# Patient Record
Sex: Male | Born: 1970 | Race: Black or African American | Hispanic: No | Marital: Single | Smoking: Current every day smoker
Health system: Southern US, Community
[De-identification: ages and names within clinical notes are randomized; demographics above are authoritative.]

## PROBLEM LIST (undated history)

## (undated) DIAGNOSIS — I4891 Unspecified atrial fibrillation: Secondary | ICD-10-CM

---

## 2019-04-21 ENCOUNTER — Encounter (HOSPITAL_COMMUNITY): Payer: Self-pay

## 2019-04-21 ENCOUNTER — Emergency Department (HOSPITAL_COMMUNITY)
Admission: EM | Admit: 2019-04-21 | Discharge: 2019-04-21 | Disposition: A | Discharge status: Home / Self Care | Payer: BLUE CROSS/BLUE SHIELD | Attending: Emergency Medicine | Admitting: Emergency Medicine

## 2019-04-21 DIAGNOSIS — F17210 Nicotine dependence, cigarettes, uncomplicated: Secondary | ICD-10-CM | POA: Diagnosis not present

## 2019-04-21 DIAGNOSIS — Z9114 Patient's other noncompliance with medication regimen: Secondary | ICD-10-CM | POA: Diagnosis not present

## 2019-04-21 DIAGNOSIS — I480 Paroxysmal atrial fibrillation: Secondary | ICD-10-CM | POA: Diagnosis not present

## 2019-04-21 DIAGNOSIS — R0602 Shortness of breath: Secondary | ICD-10-CM | POA: Diagnosis present

## 2019-04-21 DIAGNOSIS — Z7982 Long term (current) use of aspirin: Secondary | ICD-10-CM | POA: Diagnosis not present

## 2019-04-21 HISTORY — DX: Unspecified atrial fibrillation: I48.91

## 2019-04-21 LAB — BASIC METABOLIC PANEL
Anion gap: 8 (ref 5–15)
BUN: 14 mg/dL (ref 6–20)
CO2: 25 mmol/L (ref 22–32)
Calcium: 8.7 mg/dL — ABNORMAL LOW (ref 8.9–10.3)
Chloride: 109 mmol/L (ref 98–111)
Creatinine, Ser: 1.13 mg/dL (ref 0.61–1.24)
GFR calc Af Amer: >60 mL/min (ref >60)
GFR calc non Af Amer: >60 mL/min (ref >60)
Glucose, Bld: 99 mg/dL (ref 70–99)
Potassium: 4.3 mmol/L (ref 3.5–5.1)
Sodium: 142 mmol/L (ref 135–145)

## 2019-04-21 LAB — CBC WITH DIFFERENTIAL/PLATELET
Abs Immature Granulocytes: 0.01 10*3/uL (ref 0.00–0.07)
Basophils Absolute: 0 10*3/uL (ref 0.0–0.1)
Basophils Relative: 0 %
Eosinophils Absolute: 0.2 10*3/uL (ref 0.0–0.5)
Eosinophils Relative: 2 %
HCT: 41.1 % (ref 39.0–52.0)
Hemoglobin: 12.8 g/dL — ABNORMAL LOW (ref 13.0–17.0)
Immature Granulocytes: 0 %
Lymphocytes Relative: 23 %
Lymphs Abs: 2 10*3/uL (ref 0.7–4.0)
MCH: 24.1 pg — ABNORMAL LOW (ref 26.0–34.0)
MCHC: 31.1 g/dL (ref 30.0–36.0)
MCV: 77.3 fL — ABNORMAL LOW (ref 80.0–100.0)
Monocytes Absolute: 0.6 10*3/uL (ref 0.1–1.0)
Monocytes Relative: 7 %
Neutro Abs: 5.8 10*3/uL (ref 1.7–7.7)
Neutrophils Relative %: 68 %
Platelets: 293 10*3/uL (ref 150–400)
RBC: 5.32 MIL/uL (ref 4.22–5.81)
RDW: 15.9 % — ABNORMAL HIGH (ref 11.5–15.5)
WBC: 8.7 10*3/uL (ref 4.0–10.5)
nRBC: 0 % (ref 0.0–0.2)

## 2019-04-21 LAB — MAGNESIUM: Magnesium: 2 mg/dL (ref 1.7–2.4)

## 2019-04-21 LAB — TSH: TSH: 2.469 u[IU]/mL (ref 0.350–4.500)

## 2019-04-21 MED ORDER — METOPROLOL TARTRATE 25 MG PO TABS: 25.0000 mg | ORAL_TABLET | Freq: Two times a day (BID) | ORAL | 0 refills | Status: DC

## 2019-04-21 NOTE — ED Provider Notes (Signed)
[facility] EMERGENCY DEPARTMENT Provider Note   CSN: [id number] Arrival date & time: 04/21/19  9381     History Chief Complaint  Patient presents with  . Atrial Fibrillation    Jorge Gonzalez is a 49 y.o. male with PMH significant for atrial fibrillation who presents to the ED with acute onset palpitations concerning for A. Fib.  Patient reports that he felt fine yesterday when going to bed and immediately woke up, however around 7:30 AM he began feeling palpitations with mild associated shortness of breath symptoms.  Patient reports that he had been prescribed warfarin and propafenone in the past but has not taken those medications in over a year and denies any other recent episodes of atrial fibrillation.  He denies any other PMH such as high blood pressure, cholesterol, or diabetes.  He does not take medications regularly.  He works as a Psychologist, occupational for [employer] and gets regularly tested for COVID-19.  Per EMS, patient was provided 500 mL NS.  He denies any current chest pain, fevers or chills, headache or dizziness, recent illness, cough, abdominal discomfort, nausea or vomiting, or other symptoms.  HPI     Past Medical History:  Diagnosis Date  . Afib (HCC)     There are no problems to display for this patient.    No family history on file.  Social History   Tobacco Use  . Smoking status: Current Every Day Smoker    Packs/day: 0.50    Types: Cigarettes  . Smokeless tobacco: Never Used  Substance Use Topics  . Alcohol use: Not on file  . Drug use: Not on file    Home Medications Prior to Admission medications   Medication Sig Start Date End Date Taking? Authorizing Provider  aspirin 325 MG EC tablet Take 325 mg by mouth daily.   Yes [provider]    Allergies    Patient has no known allergies.  Review of Systems   Review of Systems  Constitutional: Negative for chills and fever.  Respiratory: Positive for shortness of breath.  Negative for cough.   Cardiovascular: Positive for palpitations. Negative for chest pain and leg swelling.     Physical Exam Updated Vital Signs BP (!) 128/100   Pulse (!) 51   Temp 97.9 F (36.6 C) (Oral)   Resp (!) 21   Ht 5\' 8"  (1.727 m)   Wt 86.2 kg   SpO2 99%   BMI 28.89 kg/m   Physical Exam Vitals and nursing note reviewed. Exam conducted with a chaperone present.  Constitutional:      General: He is not in acute distress.    Appearance: Normal appearance.  HENT:     Head: Normocephalic and atraumatic.  Eyes:     General: No scleral icterus.    Extraocular Movements: Extraocular movements intact.     Conjunctiva/sclera: Conjunctivae normal.     Pupils: Pupils are equal, round, and reactive to light.  Cardiovascular:     Rate and Rhythm: Normal rate. Rhythm irregular.     Pulses: Normal pulses.     Heart sounds: Normal heart sounds.  Pulmonary:     Effort: Pulmonary effort is normal. No respiratory distress.     Breath sounds: Normal breath sounds.  Musculoskeletal:        General: Normal range of motion.     Cervical back: Normal range of motion and neck supple. No rigidity.  Skin:    General: Skin is dry.  Capillary Refill: Capillary refill takes less than 2 seconds.  Neurological:     Mental Status: He is alert and oriented to person, place, and time.     GCS: GCS eye subscore is 4. GCS verbal subscore is 5. GCS motor subscore is 6.     Gait: Gait normal.  Psychiatric:        Mood and Affect: Mood normal.        Behavior: Behavior normal.        Thought Content: Thought content normal.       ED Results / Procedures / Treatments   Labs (all labs ordered are listed, but only abnormal results are displayed) Labs Reviewed  CBC WITH DIFFERENTIAL/PLATELET - Abnormal; Notable for the following components:      Result Value   Hemoglobin 12.8 (*)    MCV 77.3 (*)    MCH 24.1 (*)    RDW 15.9 (*)    All other components within normal limits  BASIC  METABOLIC PANEL - Abnormal; Notable for the following components:   Calcium 8.7 (*)    All other components within normal limits  TSH  MAGNESIUM    EKG EKG Interpretation  Date/Time:  Monday April 21 2019 08:27:28 EST Ventricular Rate:  109 PR Interval:    QRS Duration: 79 QT Interval:  323 QTC Calculation: 435 R Axis:   50 Text Interpretation: Atrial fibrillation No old tracing to compare Confirmed by Meridee Score [phone]) on 04/21/2019 8:34:53 AM   Radiology No results found.  Procedures Procedures (including critical care time)  Medications Ordered in ED Medications - No data to display  ED Course  I have reviewed the triage vital signs and the nursing notes.  Pertinent labs & imaging results that were available during my care of the patient were reviewed by me and considered in my medical decision making (see chart for details).  Clinical Course as of Apr 20 1133  Mon Apr 21, 2019  1811 49 year old male history of paroxysmal A. fib here feeling like he is in A. fib with shortness of breath for about an hour.  Not on anticoagulation.  No recent illness.  Getting labs EKG cardiac monitoring.   [MB]    Clinical Course User Index [MB] Terrilee Files, MD   MDM Rules/Calculators/A&P                      Patient is mildly symptomatic of his current episode of atrial fibrillation.  Given acuity of onset atrial fibrillation < 2 hours, do not need to anticoagulate prior to rhythm control.  Patient is resting relatively comfortably on my exam and is in no acute distress.  His CHA2DS2-VASc score is 0.    On reevaluation, patient is still complaining of his palpitations and has not spontaneously cardioverted.  We will consult cardiology.   Spoke with Dr. Cristal Deer from [facility] and she recommends two options with which we can engage in mutual decision making with patient.  We can either start him on metoprolol 25 mg twice daily and have him  follow-up with amatory referral to atrial fibrillation clinic or we can start him on warfarin prior to a cardioversion with continued warfarin afterwards.  While he believes that his episode just began this morning, it is difficult to know for certain and with cardioversion comes risk of thromboembolism.   Discussed the two options with the patient and he would prefer to start metoprolol 25 mg twice  daily and follow-up with the ambulatory referral to the atrial fibrillation clinic rather than restarting warfarin before being cardioverted here in the ED.  Dr. Melina Copa also aware of current assessment and plan and is in agreement.   Strict return precautions discussed.  All of the evaluation and work-up results were discussed with the patient and any family at bedside. They were provided opportunity to ask any additional questions and have none at this time. They have expressed understanding of verbal discharge instructions as well as return precautions and are agreeable to the plan.    Final Clinical Impression(s) / ED Diagnoses Final diagnoses:  Paroxysmal atrial fibrillation [facility])    Rx / DC Orders ED Discharge Orders         Ordered    Amb referral to AFIB Clinic     04/21/19 1041           Reita Chard 04/21/19 1135    Hayden Rasmussen, MD 04/21/19 [phone]

## 2019-04-21 NOTE — ED Triage Notes (Signed)
Pt presents to the ED by [facility] complaining of some shortness of breath. Pt reports he could feel himself go into a-fib about an hour ago. Has not been taking medications for about the last year per EMS. Given 500cc of NSS by EMS, they reported HR 80-140's en route to the ED. Pt arrives to the ED alert and oriented x4, denying any pain at presnt time. HR 107 on the monitor.

## 2019-04-21 NOTE — Discharge Instructions (Addendum)
Please follow-up with the atrial fibrillation clinic and go to your appointment, once scheduled.  Please monitor your heart rate and hold your metoprolol if your heart rate is persisting below 60 beats per minute.   It is also important that you get established with a primary care provider as soon as possible for ongoing evaluation and management of your health and wellbeing.  Please contact your insurance provider to determine who in the area accept your insurance.  Return to the ED or seek immediate medical attention for any new or worsening symptoms.

## 2019-05-09 ENCOUNTER — Encounter (HOSPITAL_COMMUNITY): Payer: Self-pay | Admitting: *Deleted

## 2019-12-01 ENCOUNTER — Encounter (HOSPITAL_BASED_OUTPATIENT_CLINIC_OR_DEPARTMENT_OTHER): Payer: Self-pay

## 2019-12-01 ENCOUNTER — Emergency Department (HOSPITAL_BASED_OUTPATIENT_CLINIC_OR_DEPARTMENT_OTHER): Payer: Self-pay

## 2019-12-01 ENCOUNTER — Other Ambulatory Visit: Payer: Self-pay

## 2019-12-01 ENCOUNTER — Inpatient Hospital Stay (HOSPITAL_BASED_OUTPATIENT_CLINIC_OR_DEPARTMENT_OTHER)
Admission: EM | Admit: 2019-12-01 | Discharge: 2019-12-05 | DRG: 193 | Disposition: A | Discharge status: Home / Self Care | Payer: Self-pay | Attending: Internal Medicine | Admitting: Internal Medicine

## 2019-12-01 DIAGNOSIS — Z7982 Long term (current) use of aspirin: Secondary | ICD-10-CM

## 2019-12-01 DIAGNOSIS — Z720 Tobacco use: Secondary | ICD-10-CM | POA: Diagnosis present

## 2019-12-01 DIAGNOSIS — Z6830 Body mass index (BMI) 30.0-30.9, adult: Secondary | ICD-10-CM

## 2019-12-01 DIAGNOSIS — E669 Obesity, unspecified: Secondary | ICD-10-CM | POA: Diagnosis present

## 2019-12-01 DIAGNOSIS — F17210 Nicotine dependence, cigarettes, uncomplicated: Secondary | ICD-10-CM | POA: Diagnosis present

## 2019-12-01 DIAGNOSIS — Z713 Dietary counseling and surveillance: Secondary | ICD-10-CM

## 2019-12-01 DIAGNOSIS — R0902 Hypoxemia: Secondary | ICD-10-CM

## 2019-12-01 DIAGNOSIS — N179 Acute kidney failure, unspecified: Secondary | ICD-10-CM | POA: Diagnosis present

## 2019-12-01 DIAGNOSIS — J9601 Acute respiratory failure with hypoxia: Secondary | ICD-10-CM | POA: Diagnosis present

## 2019-12-01 DIAGNOSIS — N181 Chronic kidney disease, stage 1: Secondary | ICD-10-CM | POA: Diagnosis present

## 2019-12-01 DIAGNOSIS — Z79899 Other long term (current) drug therapy: Secondary | ICD-10-CM

## 2019-12-01 DIAGNOSIS — I480 Paroxysmal atrial fibrillation: Secondary | ICD-10-CM | POA: Diagnosis present

## 2019-12-01 DIAGNOSIS — Z716 Tobacco abuse counseling: Secondary | ICD-10-CM

## 2019-12-01 DIAGNOSIS — Z20822 Contact with and (suspected) exposure to covid-19: Secondary | ICD-10-CM | POA: Diagnosis present

## 2019-12-01 DIAGNOSIS — J189 Pneumonia, unspecified organism: Principal | ICD-10-CM | POA: Diagnosis present

## 2019-12-01 DIAGNOSIS — Z9119 Patient's noncompliance with other medical treatment and regimen: Secondary | ICD-10-CM

## 2019-12-01 DIAGNOSIS — J188 Other pneumonia, unspecified organism: Secondary | ICD-10-CM | POA: Diagnosis present

## 2019-12-01 LAB — TROPONIN I (HIGH SENSITIVITY)
Troponin I (High Sensitivity): 4 ng/L (ref <18)
Troponin I (High Sensitivity): 4 ng/L (ref <18)

## 2019-12-01 LAB — SARS CORONAVIRUS 2 BY RT PCR (HOSPITAL ORDER, PERFORMED IN ~~LOC~~ HOSPITAL LAB): SARS Coronavirus 2: NEGATIVE

## 2019-12-01 LAB — CBC
HCT: 40.7 % (ref 39.0–52.0)
Hemoglobin: 13 g/dL (ref 13.0–17.0)
MCH: 24.5 pg — ABNORMAL LOW (ref 26.0–34.0)
MCHC: 31.9 g/dL (ref 30.0–36.0)
MCV: 76.6 fL — ABNORMAL LOW (ref 80.0–100.0)
Platelets: 270 10*3/uL (ref 150–400)
RBC: 5.31 MIL/uL (ref 4.22–5.81)
RDW: 15.6 % — ABNORMAL HIGH (ref 11.5–15.5)
WBC: 19.6 10*3/uL — ABNORMAL HIGH (ref 4.0–10.5)
nRBC: 0 % (ref 0.0–0.2)

## 2019-12-01 LAB — URINALYSIS, COMPLETE (UACMP) WITH MICROSCOPIC
Bilirubin Urine: NEGATIVE
Glucose, UA: NEGATIVE mg/dL
Ketones, ur: NEGATIVE mg/dL
Leukocytes,Ua: NEGATIVE
Nitrite: NEGATIVE
Protein, ur: NEGATIVE mg/dL
Specific Gravity, Urine: 1.01 (ref 1.005–1.030)
WBC, UA: NONE SEEN WBC/hpf (ref 0–5)
pH: 6 (ref 5.0–8.0)

## 2019-12-01 LAB — HIV ANTIBODY (ROUTINE TESTING W REFLEX): HIV Screen 4th Generation wRfx: NONREACTIVE

## 2019-12-01 LAB — COMPREHENSIVE METABOLIC PANEL
ALT: 28 U/L (ref 0–44)
AST: 39 U/L (ref 15–41)
Albumin: 3.6 g/dL (ref 3.5–5.0)
Alkaline Phosphatase: 71 U/L (ref 38–126)
Anion gap: 6 (ref 5–15)
BUN: 20 mg/dL (ref 6–20)
CO2: 28 mmol/L (ref 22–32)
Calcium: 8.4 mg/dL — ABNORMAL LOW (ref 8.9–10.3)
Chloride: 104 mmol/L (ref 98–111)
Creatinine, Ser: 1.25 mg/dL — ABNORMAL HIGH (ref 0.61–1.24)
GFR calc Af Amer: >60 mL/min (ref >60)
GFR calc non Af Amer: >60 mL/min (ref >60)
Glucose, Bld: 100 mg/dL — ABNORMAL HIGH (ref 70–99)
Potassium: 4.5 mmol/L (ref 3.5–5.1)
Sodium: 138 mmol/L (ref 135–145)
Total Bilirubin: 0.5 mg/dL (ref 0.3–1.2)
Total Protein: 6.5 g/dL (ref 6.5–8.1)

## 2019-12-01 LAB — C-REACTIVE PROTEIN: CRP: 12.2 mg/dL — ABNORMAL HIGH (ref <1.0)

## 2019-12-01 LAB — PROCALCITONIN: Procalcitonin: 6.07 ng/mL

## 2019-12-01 LAB — D-DIMER, QUANTITATIVE: D-Dimer, Quant: 0.4 ug/mL-FEU (ref 0.00–0.50)

## 2019-12-01 LAB — BRAIN NATRIURETIC PEPTIDE: B Natriuretic Peptide: 73.1 pg/mL (ref 0.0–100.0)

## 2019-12-01 LAB — LACTIC ACID, PLASMA: Lactic Acid, Venous: 1.1 mmol/L (ref 0.5–1.9)

## 2019-12-01 MED ORDER — SODIUM CHLORIDE 0.9 % IV SOLN
2.0000 g | Freq: Once | INTRAVENOUS | Status: AC
  Administered 2019-12-01: 2 g via INTRAVENOUS
  Filled 2019-12-01: qty 20

## 2019-12-01 MED ORDER — SODIUM CHLORIDE 0.9 % IV SOLN
500.0000 mg | Freq: Once | INTRAVENOUS | Status: AC
  Administered 2019-12-01: 500 mg via INTRAVENOUS
  Filled 2019-12-01: qty 500

## 2019-12-01 MED ORDER — LACTATED RINGERS IV SOLN: INTRAVENOUS | Status: AC

## 2019-12-01 MED ORDER — LACTATED RINGERS IV BOLUS
1000.0000 mL | Freq: Once | INTRAVENOUS | Status: AC
  Administered 2019-12-01: 1000 mL via INTRAVENOUS

## 2019-12-01 MED ORDER — IOHEXOL 350 MG/ML SOLN
100.0000 mL | Freq: Once | INTRAVENOUS | Status: AC | PRN
  Administered 2019-12-01: 100 mL via INTRAVENOUS

## 2019-12-01 NOTE — ED Provider Notes (Signed)
[facility]-EMERG[facility]Y DEPT [facility] Emergency Department Provider Note MRN:  [id number]  Arrival date & time: 12/01/19     Chief Complaint   Cough   History of Present Illness   Iris Tatsch is a 49 y.o. year-old male with a history of A. fib presenting to the ED with chief complaint of cough.  Patient worried that he might have Covid, has been having he assumes or Covid symptoms since 2 AM this morning.  Began with shaking chills, having some cough throughout the day, and now experiencing shortness of breath.  Denies chest pain, no abdominal pain, mild headache, no body aches, no numbness or weakness.  Symptoms constant, moderate, no exacerbating or alleviating factors.  Review of Systems  A complete 10 system review of systems was obtained and all systems are negative except as noted in the HPI and PMH.   Patient's Health History    Past Medical History:  Diagnosis Date  . Afib (HCC)     History reviewed. No pertinent surgical history.  No family history on file.  Social History   Socioeconomic History  . Marital status: Single    Spouse name: Not on file  . Number of children: Not on file  . Years of education: Not on file  . Highest education level: Not on file  Occupational History  . Not on file  Tobacco Use  . Smoking status: Current Every Day Smoker    Packs/day: 0.50    Types: Cigarettes  . Smokeless tobacco: Never Used  Vaping Use  . Vaping Use: Never used  Substance and Sexual Activity  . Alcohol use: Never  . Drug use: Never  . Sexual activity: Not on file  Other Topics Concern  . Not on file  Social History Narrative  . Not on file   Social Determinants of Health   Financial Resource Strain:   . Difficulty of Paying Living Expenses: Not on file  Food Insecurity:   . Worried About Programme researcher, broadcasting/film/video in the Last Year: Not on file  . Ran Out of Food in the Last Year: Not on file  Transportation Needs:   . Lack of Transportation (Medical):  Not on file  . Lack of Transportation (Non-Medical): Not on file  Physical Activity:   . Days of Exercise per Week: Not on file  . Minutes of Exercise per Session: Not on file  Stress:   . Feeling of Stress : Not on file  Social Connections:   . Frequency of Communication with Friends and Family: Not on file  . Frequency of Social Gatherings with Friends and Family: Not on file  . Attends Religious Services: Not on file  . Active Member of Clubs or Organizations: Not on file  . Attends Banker Meetings: Not on file  . Marital Status: Not on file  Intimate Partner Violence:   . Fear of Current or Ex-Partner: Not on file  . Emotionally Abused: Not on file  . Physically Abused: Not on file  . Sexually Abused: Not on file     Physical Exam   Vitals:   12/01/19 1755 12/01/19 1942  BP: 131/87 122/82  Pulse: 83 79  Resp: (!) 22 (!) 30  Temp:    SpO2: 97% 95%    CONSTITUTIONAL: Well-appearing, NAD NEURO:  Alert and oriented x 3, no focal deficits EYES:  eyes equal and reactive ENT/NECK:  no LAD, no JVD CARDIO: Regular rate, well-perfused, normal S1 and S2 PULM:  CTAB  no wheezing or rhonchi GI/GU:  normal bowel sounds, non-distended, non-tender MSK/SPINE:  No gross deformities, no edema SKIN:  no rash, atraumatic PSYCH:  Appropriate speech and behavior  *Additional and/or pertinent findings included in MDM below  Diagnostic and Interventional Summary    EKG Interpretation  Date/Time:  Monday December 01 2019 18:01:05 EDT Ventricular Rate:  81 PR Interval:    QRS Duration: 86 QT Interval:  347 QTC Calculation: 403 R Axis:   45 Text Interpretation: Sinus rhythm Abnormal R-wave progression, early transition Confirmed by Kennis Carina [phone]) on 12/01/2019 6:03:51 PM      Labs Reviewed  CBC - Abnormal; Notable for the following components:      Result Value   WBC 19.6 (*)    MCV 76.6 (*)    MCH 24.5 (*)    RDW 15.6 (*)    All other components within  normal limits  COMPREHENSIVE METABOLIC PANEL - Abnormal; Notable for the following components:   Glucose, Bld 100 (*)    Creatinine, Ser 1.25 (*)    Calcium 8.4 (*)    All other components within normal limits  SARS CORONAVIRUS 2 BY RT PCR (HOSPITAL ORDER, PERFORMED IN [facility] [facility])  CULTURE, BLOOD (SINGLE)  BRAIN NATRIURETIC PEPTIDE  D-DIMER, QUANTITATIVE (NOT AT [facility])  TROPONIN I (HIGH SENSITIVITY)  TROPONIN I (HIGH SENSITIVITY)    CT ANGIO CHEST PE W OR WO CONTRAST  Final Result    DG Chest Port 1 View  Final Result      Medications  cefTRIAXone (ROCEPHIN) 2 g in sodium chloride 0.9 % 100 mL IVPB (has no administration in time range)  azithromycin (ZITHROMAX) 500 mg in sodium chloride 0.9 % 250 mL IVPB (has no administration in time range)  iohexol (OMNIPAQUE) 350 MG/ML injection 100 mL (100 mLs Intravenous Contrast Given 12/01/19 1926)     Procedures  /  Critical Care Procedures  ED Course and Medical Decision Making  I have reviewed the triage vital signs, the nursing notes, and pertinent available records from the EMR.  Listed above are laboratory and imaging tests that I personally ordered, reviewed, and interpreted and then considered in my medical decision making (see below for details).  Cough, chills, shortness of breath, patient suspecting Covid but Covid test is negative here in the ED.  I was present as patient was being walked to his room and when he was initially placed on the monitor his pulse ox was 84% with good waveform, slowly improved to 94% on room air.  Now considering bacterial pneumonia, pulmonary embolism.  Also negative Covid also possible.  Work-up is pending.     Patient is now 87 to 88% SPO2 on room air while sitting at rest, placed on 2 L nasal cannula.  Laboratory evaluation is overall reassuring with negative D-dimer, however given this hypoxia the concern for PE persisted.  CT obtained to exclude this and also get more detailed  evaluation of the lungs.  No evidence of PE, diffuse infiltrate concerning for multifocal pneumonia.  Given the leukocytosis and negative Covid test, favored to be bacterial.  Patient is a current smoker.  Given ceftriaxone, azithromycin.  Patient explains that his symptoms started after cleaning out his [facility] window unit and is concerned about the mold that he found.  I am less concerned about fungal pneumonia but the possibility of Legionella is considered.  Will defer further testing to hospital service.  Elmer Sow. Pilar Plate, MD [facility] Emergency [facility]  Health [email or web address]  Final Clinical Impressions(s) / ED Diagnoses     ICD-10-CM   1. Multifocal pneumonia  J18.9   2. Hypoxia  R09.02     ED Discharge Orders    None       Discharge Instructions Discussed with and Provided to Patient:   Discharge Instructions   None       Sabas Sous, MD 12/01/19 1950

## 2019-12-01 NOTE — ED Notes (Signed)
Pt on monitor 

## 2019-12-01 NOTE — ED Notes (Signed)
Patient transported to CT 

## 2019-12-01 NOTE — ED Triage Notes (Signed)
Pt c/o "covid symptoms" started ~2am-NAD-steady gait

## 2019-12-01 NOTE — ED Notes (Signed)
Pt room air 86-88%, pt placed on 2L [place] and RT at bedside

## 2019-12-02 DIAGNOSIS — J188 Other pneumonia, unspecified organism: Secondary | ICD-10-CM | POA: Diagnosis present

## 2019-12-02 DIAGNOSIS — J189 Pneumonia, unspecified organism: Principal | ICD-10-CM

## 2019-12-02 LAB — COMPREHENSIVE METABOLIC PANEL
ALT: 21 U/L (ref 0–44)
AST: 27 U/L (ref 15–41)
Albumin: 3.6 g/dL (ref 3.5–5.0)
Alkaline Phosphatase: 76 U/L (ref 38–126)
Anion gap: 11 (ref 5–15)
BUN: 14 mg/dL (ref 6–20)
CO2: 23 mmol/L (ref 22–32)
Calcium: 8.7 mg/dL — ABNORMAL LOW (ref 8.9–10.3)
Chloride: 105 mmol/L (ref 98–111)
Creatinine, Ser: 0.97 mg/dL (ref 0.61–1.24)
GFR calc Af Amer: >60 mL/min (ref >60)
GFR calc non Af Amer: >60 mL/min (ref >60)
Glucose, Bld: 93 mg/dL (ref 70–99)
Potassium: 4.5 mmol/L (ref 3.5–5.1)
Sodium: 139 mmol/L (ref 135–145)
Total Bilirubin: 0.5 mg/dL (ref 0.3–1.2)
Total Protein: 6.8 g/dL (ref 6.5–8.1)

## 2019-12-02 LAB — CBC WITH DIFFERENTIAL/PLATELET
Abs Immature Granulocytes: 0.04 10*3/uL (ref 0.00–0.07)
Basophils Absolute: 0 10*3/uL (ref 0.0–0.1)
Basophils Relative: 0 %
Eosinophils Absolute: 0.2 10*3/uL (ref 0.0–0.5)
Eosinophils Relative: 1 %
HCT: 40.3 % (ref 39.0–52.0)
Hemoglobin: 12.8 g/dL — ABNORMAL LOW (ref 13.0–17.0)
Immature Granulocytes: 0 %
Lymphocytes Relative: 9 %
Lymphs Abs: 1.5 10*3/uL (ref 0.7–4.0)
MCH: 24.4 pg — ABNORMAL LOW (ref 26.0–34.0)
MCHC: 31.8 g/dL (ref 30.0–36.0)
MCV: 76.9 fL — ABNORMAL LOW (ref 80.0–100.0)
Monocytes Absolute: 0.9 10*3/uL (ref 0.1–1.0)
Monocytes Relative: 6 %
Neutro Abs: 13.2 10*3/uL — ABNORMAL HIGH (ref 1.7–7.7)
Neutrophils Relative %: 84 %
Platelets: 273 10*3/uL (ref 150–400)
RBC: 5.24 MIL/uL (ref 4.22–5.81)
RDW: 15.6 % — ABNORMAL HIGH (ref 11.5–15.5)
WBC: 15.8 10*3/uL — ABNORMAL HIGH (ref 4.0–10.5)
nRBC: 0 % (ref 0.0–0.2)

## 2019-12-02 LAB — RESP PANEL BY RT PCR (RSV, FLU A&B, COVID)
Influenza A by PCR: NEGATIVE
Influenza B by PCR: NEGATIVE
Respiratory Syncytial Virus by PCR: NEGATIVE
SARS Coronavirus 2 by RT PCR: NEGATIVE

## 2019-12-02 LAB — STREP PNEUMONIAE URINARY ANTIGEN: Strep Pneumo Urinary Antigen: NEGATIVE

## 2019-12-02 MED ORDER — ENOXAPARIN SODIUM 40 MG/0.4ML ~~LOC~~ SOLN: 40.0000 mg | SUBCUTANEOUS | Status: DC

## 2019-12-02 MED ORDER — ACETAMINOPHEN 325 MG PO TABS
650.0000 mg | ORAL_TABLET | ORAL | Status: DC | PRN
  Administered 2019-12-02 – 2019-12-03 (×2): 650 mg via ORAL
  Filled 2019-12-02 (×2): qty 2

## 2019-12-02 MED ORDER — SODIUM CHLORIDE 0.9 % IV SOLN: INTRAVENOUS | Status: DC

## 2019-12-02 MED ORDER — METOPROLOL TARTRATE 25 MG PO TABS
25.0000 mg | ORAL_TABLET | Freq: Two times a day (BID) | ORAL | Status: DC
  Administered 2019-12-02 – 2019-12-05 (×6): 25 mg via ORAL
  Filled 2019-12-02 (×6): qty 1

## 2019-12-02 MED ORDER — IBUPROFEN 200 MG PO TABS
600.0000 mg | ORAL_TABLET | Freq: Once | ORAL | Status: AC
  Administered 2019-12-03: 600 mg via ORAL
  Filled 2019-12-02: qty 3

## 2019-12-02 MED ORDER — ASPIRIN EC 325 MG PO TBEC
325.0000 mg | DELAYED_RELEASE_TABLET | Freq: Every day | ORAL | Status: DC
  Administered 2019-12-02 – 2019-12-05 (×4): 325 mg via ORAL
  Filled 2019-12-02 (×3): qty 1

## 2019-12-02 MED ORDER — SODIUM CHLORIDE 0.9 % IV SOLN
2.0000 g | INTRAVENOUS | Status: DC
  Administered 2019-12-02 – 2019-12-04 (×3): 2 g via INTRAVENOUS
  Filled 2019-12-02: qty 20
  Filled 2019-12-02 (×2): qty 2

## 2019-12-02 MED ORDER — SODIUM CHLORIDE 0.9 % IV SOLN
500.0000 mg | INTRAVENOUS | Status: DC
  Administered 2019-12-02 – 2019-12-03 (×2): 500 mg via INTRAVENOUS
  Filled 2019-12-02 (×2): qty 500

## 2019-12-02 NOTE — H&P (Signed)
HPI  Jorge Gonzalez [id number] DOB: March 02, 1971 DOA: 12/01/2019  PCP: Patient, [name]   Chief Complaint: Fever chills  HPI:  49 year old male PMH P A. 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 from this and contracting pneumonia as well He does not have any underlying asthma or airway issues but still smokes half pack per day for the past 20 or 30 years-he does not use inhalers nor other medications for lung issues  He has been noncompliant for while on his metoprolol because he did not have a prescription for the same and is taking aspirin 325-had seen a cardiologist locally but this is not available in our system as to who he had seen  Thought he had Covid secondary to having chills and rigors 2 to 3 days prior to coming in in addition to drenching sweats  In emergency room found to have O2 sat 84%, CXR = no active disease however CT chest showed moderate to marked severe diffuse bilateral infiltrates on x-ray  Review of Systems:    Pertinent +'s: Chills Reiger fever Pertinent -"s: Nausea vomiting upper abdominal pain diarrhea myalgia blurred vision double vision unilateral weakness loss of smell taste dark stool tarry stool reflux-like symptoms seizure  ED Course: Started goal-directed ceftriaxone azithromycin sepsis protocol  Given Tylenol given ibuprofen obtain Legionella HIV respiratory viral panel Sputum ordered   Past Medical History:  Diagnosis Date  . Afib (HCC)    History reviewed. No pertinent surgical history.  reports that he has been smoking cigarettes. He has been smoking about 0.50 packs per day. He has never used smokeless tobacco. He reports that he does not drink alcohol  and does not use drugs.  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 etc.  No Known Allergies No family history on file. Prior to Admission medications   Medication Sig Start Date End Date Taking? Authorizing Provider  aspirin 325 MG EC tablet Take 325 mg by mouth daily.    [provider]  metoprolol tartrate (LOPRESSOR) 25 MG tablet Take 1 tablet (25 mg total) by mouth 2 (two) times daily. 04/21/19   Lorelee New, PA-C    Physical Exam:  Vitals:   12/02/19 1443 12/02/19 1617  BP: 119/85 119/80  Pulse: 81 93  Resp: 19 20  Temp:  99.2 F (37.3 C)  SpO2: 100% 95%     Pleasant oriented looking younger than stated age thick neck Mallampati 2  No JVD no bruit  S1-S2 sinus rhythm without adventitious sounds regular rate rhythm  Abdomen soft obese nontender no hepatosplenomegaly  ROM intact to major muscle groups major joints knees elbows without swelling  No lower extremity edema no rash  Psych euthymic pleasant coherent  Chest clinically clear no rales no rhonchi no TVR no TVF no egophony  I have personally reviewed following labs and imaging studies  Labs:   BUNs/creatinine 20/1.2 up from baseline 14/1.13  BNP 73  Troponin IV  CRP  12, procalcitonin 6, lactic acid 1.1, CRP 12  D-dimer 0.4  White count 19.6 hemoglobin 13.0 platelet 270  Imaging studies:   CT angiogram chest mild left hilar lymphadenopathy-lungs and pleura moderate to marked severe bilateral diffuse infiltrates  No evidence of pulmonary embolism  Medical tests:   EKG independently reviewed: EKG my over read PR interval 0.12 QRS axis 50 degrees no ST-T wave elevation across precordial leads seems to be predominant sinus rhythm  Test discussed with  performing physician:  I did not discuss return the ER physician  Decision to obtain old records:   Reviewed all old records and summarized  Review and summation of old records:   As above  Active Problems:   Pneumonia of both lungs due to infectious organism   Multifocal pneumonia   Assessment/Plan Hypoxic respiratory failure secondary to probable bacterial pneumonia without overt sepsis   Likely community-acquired pneumonia  Continue IV fluids saline 75 cc/h  Continue oxygen 2 L but may be able to wean to room air with desat screen which is ordered for a.m.  Continue empiric azithromycin ceftriaxone  Follow sputum, HIV, Legionella, pneumococcal urinary antigen  Patient already feels improved so doubt this is a fungal type of infection as he seems to well for the same-consideration must be given however if he decompensates to start covering with antifungals and I would then speak at that time to pulmonary and infectious disease to help guide therapy although fungal infection once again is quite unlikely Paroxysmal A. fib not on anticoagulation CHADS2 score less than 1  Not compliant with metoprolol which has been reinstituted  Continue aspirin 325 mg but may be dropped the dose on discharge 81 mg  Needs reestablishment with cardiology to determine if he should even continue these meds or if he needs a Holter-unclear of the circumstances as to how he was diagnosed with A. 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 Inpatient status is judged to be  reasonable and necessary in order to provide the required intensity of service to ensure the patient's safety. The patient's presenting symptoms, physical exam findings, and initial radiographic and laboratory data in the context of their chronic comorbidities is felt to place them at high risk for further clinical deterioration. Furthermore, it is not anticipated that the patient will be medically stable for discharge from the hospital within 2 midnights of admission. The following factors support the patient status of inpatient.   " The patient's presenting symptoms include hypoxia. " The worrisome physical exam findings include tachycardia initially. " The initial radiographic and laboratory data are worrisome because of bilateral pulmonary involvement. " The chronic co-morbidities include obesity A. fib.   * I certify that at the point of admission it is my clinical judgment that the patient will require inpatient hospital care spanning beyond 2 midnights from the point of admission due to high intensity of service, high risk for further deterioration and high frequency of surveillance required.*   DVT prophylaxis: Lovenox Code Status: Full Family Communication: None Consults called: None  Time spent: 35 minutes  Mahala Menghini, MD [facility]  my NP partners at night for Care related issues] [facility] --[employer] [place] , [email or web address]; password [email or web address]  12/02/2019, 5:38 PM

## 2019-12-02 NOTE — Progress Notes (Signed)
Report received from [facility].

## 2019-12-03 ENCOUNTER — Inpatient Hospital Stay (HOSPITAL_COMMUNITY): Payer: Self-pay

## 2019-12-03 DIAGNOSIS — I480 Paroxysmal atrial fibrillation: Secondary | ICD-10-CM

## 2019-12-03 DIAGNOSIS — Z720 Tobacco use: Secondary | ICD-10-CM | POA: Diagnosis present

## 2019-12-03 DIAGNOSIS — J9601 Acute respiratory failure with hypoxia: Secondary | ICD-10-CM

## 2019-12-03 DIAGNOSIS — N179 Acute kidney failure, unspecified: Secondary | ICD-10-CM | POA: Diagnosis present

## 2019-12-03 LAB — PROCALCITONIN: Procalcitonin: 2.12 ng/mL

## 2019-12-03 LAB — CBC WITH DIFFERENTIAL/PLATELET
Abs Immature Granulocytes: 0.04 10*3/uL (ref 0.00–0.07)
Basophils Absolute: 0 10*3/uL (ref 0.0–0.1)
Basophils Relative: 0 %
Eosinophils Absolute: 0.3 10*3/uL (ref 0.0–0.5)
Eosinophils Relative: 2 %
HCT: 37.8 % — ABNORMAL LOW (ref 39.0–52.0)
Hemoglobin: 12 g/dL — ABNORMAL LOW (ref 13.0–17.0)
Immature Granulocytes: 0 %
Lymphocytes Relative: 10 %
Lymphs Abs: 1.3 10*3/uL (ref 0.7–4.0)
MCH: 24.5 pg — ABNORMAL LOW (ref 26.0–34.0)
MCHC: 31.7 g/dL (ref 30.0–36.0)
MCV: 77.3 fL — ABNORMAL LOW (ref 80.0–100.0)
Monocytes Absolute: 0.8 10*3/uL (ref 0.1–1.0)
Monocytes Relative: 6 %
Neutro Abs: 10.1 10*3/uL — ABNORMAL HIGH (ref 1.7–7.7)
Neutrophils Relative %: 82 %
Platelets: 259 10*3/uL (ref 150–400)
RBC: 4.89 MIL/uL (ref 4.22–5.81)
RDW: 15.8 % — ABNORMAL HIGH (ref 11.5–15.5)
WBC: 12.5 10*3/uL — ABNORMAL HIGH (ref 4.0–10.5)
nRBC: 0 % (ref 0.0–0.2)

## 2019-12-03 LAB — BASIC METABOLIC PANEL
Anion gap: 14 (ref 5–15)
BUN: 9 mg/dL (ref 6–20)
CO2: 20 mmol/L — ABNORMAL LOW (ref 22–32)
Calcium: 8.3 mg/dL — ABNORMAL LOW (ref 8.9–10.3)
Chloride: 105 mmol/L (ref 98–111)
Creatinine, Ser: 1.01 mg/dL (ref 0.61–1.24)
GFR calc Af Amer: >60 mL/min (ref >60)
GFR calc non Af Amer: >60 mL/min (ref >60)
Glucose, Bld: 165 mg/dL — ABNORMAL HIGH (ref 70–99)
Potassium: 3.8 mmol/L (ref 3.5–5.1)
Sodium: 139 mmol/L (ref 135–145)

## 2019-12-03 LAB — MAGNESIUM: Magnesium: 1.9 mg/dL (ref 1.7–2.4)

## 2019-12-03 LAB — LEGIONELLA PNEUMOPHILA SEROGP 1 UR AG: L. pneumophila Serogp 1 Ur Ag: NEGATIVE

## 2019-12-03 MED ORDER — IPRATROPIUM BROMIDE 0.02 % IN SOLN
0.5000 mg | Freq: Two times a day (BID) | RESPIRATORY_TRACT | Status: DC
  Administered 2019-12-03 – 2019-12-05 (×4): 0.5 mg via RESPIRATORY_TRACT
  Filled 2019-12-03 (×4): qty 2.5

## 2019-12-03 MED ORDER — GUAIFENESIN ER 600 MG PO TB12
1200.0000 mg | ORAL_TABLET | Freq: Two times a day (BID) | ORAL | Status: DC
  Administered 2019-12-03 – 2019-12-05 (×5): 1200 mg via ORAL
  Filled 2019-12-03 (×5): qty 2

## 2019-12-03 MED ORDER — LEVALBUTEROL TARTRATE 45 MCG/ACT IN AERO: 2.0000 | INHALATION_SPRAY | Freq: Four times a day (QID) | RESPIRATORY_TRACT | Status: DC

## 2019-12-03 MED ORDER — IPRATROPIUM BROMIDE HFA 17 MCG/ACT IN AERS: 2.0000 | INHALATION_SPRAY | Freq: Four times a day (QID) | RESPIRATORY_TRACT | Status: DC

## 2019-12-03 MED ORDER — PANTOPRAZOLE SODIUM 40 MG PO TBEC
40.0000 mg | DELAYED_RELEASE_TABLET | Freq: Every day | ORAL | Status: DC
  Administered 2019-12-03 – 2019-12-05 (×3): 40 mg via ORAL
  Filled 2019-12-03 (×3): qty 1

## 2019-12-03 MED ORDER — LORATADINE 10 MG PO TABS
10.0000 mg | ORAL_TABLET | Freq: Every day | ORAL | Status: DC
  Administered 2019-12-03 – 2019-12-05 (×3): 10 mg via ORAL
  Filled 2019-12-03 (×3): qty 1

## 2019-12-03 MED ORDER — MOMETASONE FURO-FORMOTEROL FUM 100-5 MCG/ACT IN AERO
2.0000 | INHALATION_SPRAY | Freq: Two times a day (BID) | RESPIRATORY_TRACT | Status: DC
  Administered 2019-12-03 – 2019-12-05 (×4): 2 via RESPIRATORY_TRACT
  Filled 2019-12-03: qty 8.8

## 2019-12-03 MED ORDER — LEVALBUTEROL HCL 0.63 MG/3ML IN NEBU
0.6300 mg | INHALATION_SOLUTION | Freq: Two times a day (BID) | RESPIRATORY_TRACT | Status: DC
  Administered 2019-12-03 – 2019-12-05 (×4): 0.63 mg via RESPIRATORY_TRACT
  Filled 2019-12-03 (×4): qty 3

## 2019-12-03 MED ORDER — LEVALBUTEROL HCL 0.63 MG/3ML IN NEBU
0.6300 mg | INHALATION_SOLUTION | Freq: Four times a day (QID) | RESPIRATORY_TRACT | Status: DC
  Administered 2019-12-03: 0.63 mg via RESPIRATORY_TRACT

## 2019-12-03 MED ORDER — IPRATROPIUM BROMIDE 0.02 % IN SOLN
0.5000 mg | Freq: Four times a day (QID) | RESPIRATORY_TRACT | Status: DC
  Administered 2019-12-03: 0.5 mg via RESPIRATORY_TRACT

## 2019-12-03 MED ORDER — FLUTICASONE PROPIONATE 50 MCG/ACT NA SUSP
2.0000 | Freq: Every day | NASAL | Status: DC
  Filled 2019-12-03: qty 16

## 2019-12-03 NOTE — Progress Notes (Signed)
PROGRESS NOTE    Jorge Gonzalez  ZTI:[id number] DOB: December 03, 1970 DOA: 12/01/2019 PCP: Patient, [name]    Chief Complaint  Patient presents with  . Cough    Brief Narrative:  HPI per Dr. Mahala Menghini 49 year old male PMH P A. 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 from this and contracting pneumonia as well He does not have any underlying asthma or airway issues but still smokes half pack per day for the past 20 or 30 years-he does not use inhalers nor other medications for lung issues  He has been noncompliant for while on his metoprolol because he did not have a prescription for the same and is taking aspirin 325-had seen a cardiologist locally but this is not available in our system as to who he had seen  Thought he had Covid secondary to having chills and rigors 2 to 3 days prior to coming in in addition to drenching sweats  In emergency room found to have O2 sat 84%, CXR = no active disease however CT chest showed moderate to marked severe diffuse bilateral infiltrates on x-ray  Assessment & Plan:   Principal Problem:   Acute hypoxemic respiratory failure (HCC) Active Problems:   Pneumonia of both lungs due to infectious organism   Multifocal pneumonia   PNA (pneumonia)   AF (paroxysmal atrial fibrillation) (HCC)   Tobacco abuse   AKI (acute kidney injury) (HCC)  #1 acute hypoxic respiratory failure secondary to probable bacterial pneumonia/CAP Patient had presented with cough, some chills, shortness of breath, hypoxia noted with sats of 84% on room air.  COVID-19 PCR which was done was negative.  Chest x-ray concerning for bilateral pneumonia.  Patient placed on 2 L O2 with some clinical  improvement.  Leukocytosis trending down.  Urine strep pneumococcus antigen negative.  Urine Legionella antigen pending.  Placed on Mucinex, Flonase, Claritin, Dulera, Protonix.  Continue IV Rocephin and IV azithromycin.  Could likely transition to oral antibiotics in the next 1 to 2 days.  Supportive care.  2.  Paroxysmal atrial fibrillation CHA2DS2VASC score <1 Patient noted to have been noncompliant with his metoprolol due to the fact he did not have any more prescriptions for refills.  Metoprolol resumed for rate control.  Continue aspirin at 325 mg daily and could likely decrease to 81 mg on discharge.  Will need to follow-up with cardiology in the outpatient setting.  Follow.  3.  Tobacco abuse Tobacco cessation.  4.  Acute kidney injury Improved with hydration.  Saline lock IV fluids.   DVT prophylaxis: SCDs Code Status: Full Family Communication: Updated patient.  No family at bedside. Disposition:   Status is: Inpatient    Dispo:  Patient From: Home  Planned Disposition: Home  Expected discharge date: 12/05/19  Medically stable for discharge: No        Consultants:   None  Procedures:   CT angiogram chest 12/01/2019  Chest x-ray 12/03/2019, 12/01/2019  Antimicrobials:   IV azithromycin 12/01/2019>>>>>>  IV Rocephin 12/01/2019>>>>>>   Subjective: Patient sitting up in bed.  States he is feeling better.  Denies any significant chest pain.  Shortness of breath improving.  Asking whether his landlord can be informed that  he is Covid negative in order for them to help fix his air conditioning unit.  Objective: Vitals:   12/03/19 0412 12/03/19 1048 12/03/19 1049 12/03/19 1539  BP: 110/80   117/77  Pulse: 79   79  Resp: 20   15  Temp: 99.4 F (37.4 C)   98.2 F (36.8 C)  TempSrc: Oral   Oral  SpO2: 95% 98% 98% 99%  Weight:      Height:        Intake/Output Summary (Last 24 hours) at 12/03/2019 1844 Last data filed at 12/03/2019 1600 Gross per 24 hour    Intake 1676.56 ml  Output --  Net 1676.56 ml   Filed Weights   12/01/19 1459 12/02/19 1530  Weight: 91.6 kg 92.9 kg    Examination:  General exam: Appears calm and comfortable  Respiratory system: Some bibasilar coarse breath sounds.  Some scattered crackles.  No wheezing.  Normal respiratory effort.  Cardiovascular system: S1 & S2 heard, RRR. No JVD, murmurs, rubs, gallops or clicks. No pedal edema. Gastrointestinal system: Abdomen is nondistended, soft and nontender. No organomegaly or masses felt. Normal bowel sounds heard. Central nervous system: Alert and oriented. No focal neurological deficits. Extremities: Symmetric 5 x 5 power. Skin: No rashes, lesions or ulcers Psychiatry: Judgement and insight appear normal. Mood & affect appropriate.     Data Reviewed: I have personally reviewed following labs and imaging studies  CBC: Recent Labs  Lab 12/01/19 1838 12/02/19 1921 12/03/19 0913  WBC 19.6* 15.8* 12.5*  NEUTROABS  --  13.2* 10.1*  HGB 13.0 12.8* 12.0*  HCT 40.7 40.3 37.8*  MCV 76.6* 76.9* 77.3*  PLT 270 273 259    Basic Metabolic Panel: Recent Labs  Lab 12/01/19 1838 12/02/19 1921 12/03/19 0913  NA 138 139 139  K 4.5 4.5 3.8  CL 104 105 105  CO2 28 23 20*  GLUCOSE 100* 93 165*  BUN CREATININE 1.25* 0.97 1.01  CALCIUM 8.4* 8.7* 8.3*  MG  --   --  1.9    GFR: Estimated Creatinine Clearance: 96.8 mL/min (by C-G formula based on SCr of 1.01 mg/dL).  Liver Function Tests: Recent Labs  Lab 12/01/19 1838 12/02/19 1921  AST 39 27  ALT 28 21  ALKPHOS 71 76  BILITOT 0.5 0.5  PROT 6.5 6.8  ALBUMIN 3.6 3.6    CBG: No results for input(s): GLUCAP in the last 168 hours.   Recent Results (from the past 240 hour(s))  SARS Coronavirus 2 by RT PCR (hospital order, performed in [facility] hospital lab) Nasopharyngeal Nasopharyngeal Swab     Status: None   Collection Time: 12/01/19  3:04 PM   Specimen: Nasopharyngeal Swab  Result Value  Ref Range Status   SARS Coronavirus 2 NEGATIVE NEGATIVE Final    Comment: (NOTE) SARS-CoV-2 target nucleic acids are NOT DETECTED.  The SARS-CoV-2 RNA is generally detectable in upper and lower respiratory specimens during the acute phase of infection. The lowest concentration of SARS-CoV-2 viral copies this assay can detect is 250 copies / mL. A negative result does not preclude SARS-CoV-2 infection and should not be used as the sole basis for treatment or other patient management decisions.  A negative result may occur with improper specimen collection / handling, submission of specimen other than nasopharyngeal swab, presence of viral mutation(s) within the areas targeted by this assay, and inadequate number of viral copies (<250 copies / mL). A negative result must be combined with  clinical observations, patient history, and epidemiological information.  Fact Sheet for Patients:   BoilerBrush.com.cy  Fact Sheet for Healthcare Providers: https://pope.com/  This test is not yet approved or  cleared by the [place] FDA and has been authorized for detection and/or diagnosis of SARS-CoV-2 by FDA under an Emergency Use Authorization (EUA).  This EUA will remain in effect (meaning this test can be used) for the duration of the COVID-19 declaration under Section 564(b)(1) of the Act, 21 U.S.C. section 360bbb-3(b)(1), unless the authorization is terminated or revoked sooner.  Performed at [facility], [address] [place], [place] [address]   Culture, blood (single)     Status: None (Preliminary result)   Collection Time: 12/01/19  8:27 PM   Specimen: BLOOD  Result Value Ref Range Status   Specimen Description   Final    BLOOD RIGHT ANTECUBITAL Performed at [facility], [address] [place], [place] [address]    Special Requests   Final    BOTTLES DRAWN AEROBIC AND ANAEROBIC Blood Culture adequate  volume Performed at [facility], [address] [place], [place] [address]    Culture   Final    NO GROWTH 2 DAYS Performed at [facility], [address] [place], [place] [address]    Report Status PENDING  Incomplete  Resp Panel by RT PCR (RSV, Flu A&B, Covid) - Nasopharyngeal Swab     Status: None   Collection Time: 12/02/19  1:58 PM   Specimen: Nasopharyngeal Swab  Result Value Ref Range Status   SARS Coronavirus 2 by RT PCR NEGATIVE NEGATIVE Final    Comment: (NOTE) SARS-CoV-2 target nucleic acids are NOT DETECTED.  The SARS-CoV-2 RNA is generally detectable in upper respiratoy specimens during the acute phase of infection. The lowest concentration of SARS-CoV-2 viral copies this assay can detect is 131 copies/mL. A negative result does not preclude SARS-Cov-2 infection and should not be used as the sole basis for treatment or other patient management decisions. A negative result may occur with  improper specimen collection/handling, submission of specimen other than nasopharyngeal swab, presence of viral mutation(s) within the areas targeted by this assay, and inadequate number of viral copies (<131 copies/mL). A negative result must be combined with clinical observations, patient history, and epidemiological information. The expected result is Negative.  Fact Sheet for Patients:  https://www.moore.com/  Fact Sheet for Healthcare Providers:  https://www.young.biz/  This test is no t yet approved or cleared by the [place] FDA and  has been authorized for detection and/or diagnosis of SARS-CoV-2 by FDA under an Emergency Use Authorization (EUA). This EUA will remain  in effect (meaning this test can be used) for the duration of the COVID-19 declaration under Section 564(b)(1) of the Act, 21 U.S.C. section 360bbb-3(b)(1), unless the authorization is terminated or revoked sooner.     Influenza A by PCR  NEGATIVE NEGATIVE Final   Influenza B by PCR NEGATIVE NEGATIVE Final    Comment: (NOTE) The Xpert Xpress SARS-CoV-2/FLU/RSV assay is intended as an aid in  the diagnosis of influenza from Nasopharyngeal swab specimens and  should not be used as a sole basis for treatment. Nasal washings and  aspirates are unacceptable for Xpert Xpress SARS-CoV-2/FLU/RSV  testing.  Fact Sheet for Patients: https://www.moore.com/  Fact Sheet for Healthcare Providers: https://www.young.biz/  This test is not yet approved or cleared by the [place] FDA and  has been authorized for detection and/or  diagnosis of SARS-CoV-2 by  FDA under an Emergency Use Authorization (EUA). This EUA will remain  in effect (meaning this test can be used) for the duration of the  Covid-19 declaration under Section 564(b)(1) of the Act, 21  U.S.C. section 360bbb-3(b)(1), unless the authorization is  terminated or revoked.    Respiratory Syncytial Virus by PCR NEGATIVE NEGATIVE Final    Comment: (NOTE) Fact Sheet for Patients: https://www.moore.com/  Fact Sheet for Healthcare Providers: https://www.young.biz/  This test is not yet approved or cleared by the [place] FDA and  has been authorized for detection and/or diagnosis of SARS-CoV-2 by  FDA under an Emergency Use Authorization (EUA). This EUA will remain  in effect (meaning this test can be used) for the duration of the  COVID-19 declaration under Section 564(b)(1) of the Act, 21 U.S.C.  section 360bbb-3(b)(1), unless the authorization is terminated or  revoked. Performed at [facility], [address] [place], [place] [address]          Radiology Studies: DG Chest 2 View  Result Date: 12/03/2019 CLINICAL DATA:  Pneumonia EXAM: CHEST - 2 VIEW COMPARISON:  12/01/2019 CTA chest and the chest radiograph. FINDINGS: Diffuse bilateral interstitial opacities.  Hypoinflated lungs. No pneumothorax or pleural effusion. Cardiomediastinal silhouette is unchanged. No acute osseous abnormality. IMPRESSION: Diffuse interstitial opacities concerning for infection and superimposed edema. Electronically Signed   By: Stana Bunting M.D.   On: 12/03/2019 10:43   CT ANGIO CHEST PE W OR WO CONTRAST  Result Date: 12/01/2019 CLINICAL DATA:  Cough and fever with shortness of breath. EXAM: CT ANGIOGRAPHY CHEST WITH CONTRAST TECHNIQUE: Multidetector CT imaging of the chest was performed using the standard protocol during bolus administration of intravenous contrast. Multiplanar CT image reconstructions and MIPs were obtained to evaluate the vascular anatomy. CONTRAST:  OMNIPAQUE IOHEXOL 350 MG/ML SOLN COMPARISON:  None. FINDINGS: Cardiovascular: Satisfactory opacification of the pulmonary arteries to the segmental level. No evidence of pulmonary embolism. Normal heart size. No pericardial effusion. Mediastinum/Nodes: There is very mild left hilar lymphadenopathy. Thyroid gland, trachea, and esophagus demonstrate no significant findings. Lungs/Pleura: Moderate to marked severity diffuse bilateral infiltrates are seen. There is no evidence of a pleural effusion or pneumothorax. Upper Abdomen: No acute abnormality. Musculoskeletal: No chest wall abnormality. No acute or significant osseous findings. Review of the MIP images confirms the above findings. IMPRESSION: Moderate to marked severity diffuse bilateral infiltrates. Electronically Signed   By: Aram Candela M.D.   On: 12/01/2019 19:36        Scheduled Meds: . aspirin  325 mg Oral Daily  . fluticasone  2 spray Each Nare Daily  . guaiFENesin  1,200 mg Oral BID  . ipratropium  0.5 mg Nebulization BID  . levalbuterol  0.63 mg Nebulization BID  . loratadine  10 mg Oral Daily  . metoprolol tartrate  25 mg Oral BID  . mometasone-formoterol  2 puff Inhalation BID  . pantoprazole  40 mg Oral Q0600   Continuous  Infusions: . azithromycin Stopped (12/02/19 2255)  . cefTRIAXone (ROCEPHIN)  IV Stopped (12/02/19 2038)     LOS: 1 day    Time spent: 35 minutes    Ramiro Harvest, MD [facility]   To contact the attending provider between 7A-7P or the covering provider during after hours 7P-7A, please log into the web site [email or web address] and access using universal [place] password for that web site. If you do not have the password, please call the hospital operator.  12/03/2019, 6:44 PM

## 2019-12-04 LAB — CBC WITH DIFFERENTIAL/PLATELET
Abs Immature Granulocytes: 0.02 10*3/uL (ref 0.00–0.07)
Basophils Absolute: 0 10*3/uL (ref 0.0–0.1)
Basophils Relative: 0 %
Eosinophils Absolute: 0.4 10*3/uL (ref 0.0–0.5)
Eosinophils Relative: 4 %
HCT: 36.8 % — ABNORMAL LOW (ref 39.0–52.0)
Hemoglobin: 11.5 g/dL — ABNORMAL LOW (ref 13.0–17.0)
Immature Granulocytes: 0 %
Lymphocytes Relative: 13 %
Lymphs Abs: 1.2 10*3/uL (ref 0.7–4.0)
MCH: 24.2 pg — ABNORMAL LOW (ref 26.0–34.0)
MCHC: 31.3 g/dL (ref 30.0–36.0)
MCV: 77.3 fL — ABNORMAL LOW (ref 80.0–100.0)
Monocytes Absolute: 0.5 10*3/uL (ref 0.1–1.0)
Monocytes Relative: 6 %
Neutro Abs: 7.2 10*3/uL (ref 1.7–7.7)
Neutrophils Relative %: 77 %
Platelets: 262 10*3/uL (ref 150–400)
RBC: 4.76 MIL/uL (ref 4.22–5.81)
RDW: 16 % — ABNORMAL HIGH (ref 11.5–15.5)
WBC: 9.4 10*3/uL (ref 4.0–10.5)
nRBC: 0 % (ref 0.0–0.2)

## 2019-12-04 LAB — PROCALCITONIN: Procalcitonin: 1.33 ng/mL

## 2019-12-04 LAB — BASIC METABOLIC PANEL
Anion gap: 8 (ref 5–15)
BUN: 10 mg/dL (ref 6–20)
CO2: 22 mmol/L (ref 22–32)
Calcium: 8.4 mg/dL — ABNORMAL LOW (ref 8.9–10.3)
Chloride: 110 mmol/L (ref 98–111)
Creatinine, Ser: 0.94 mg/dL (ref 0.61–1.24)
GFR calc Af Amer: >60 mL/min (ref >60)
GFR calc non Af Amer: >60 mL/min (ref >60)
Glucose, Bld: 112 mg/dL — ABNORMAL HIGH (ref 70–99)
Potassium: 4 mmol/L (ref 3.5–5.1)
Sodium: 140 mmol/L (ref 135–145)

## 2019-12-04 LAB — BRAIN NATRIURETIC PEPTIDE: B Natriuretic Peptide: 66.8 pg/mL (ref 0.0–100.0)

## 2019-12-04 MED ORDER — FUROSEMIDE 10 MG/ML IJ SOLN
20.0000 mg | Freq: Once | INTRAMUSCULAR | Status: AC
  Administered 2019-12-04: 20 mg via INTRAVENOUS
  Filled 2019-12-04: qty 2

## 2019-12-04 MED ORDER — AZITHROMYCIN 250 MG PO TABS: 500.0000 mg | ORAL_TABLET | Freq: Every day | ORAL | Status: DC

## 2019-12-04 MED ORDER — AZITHROMYCIN 250 MG PO TABS
500.0000 mg | ORAL_TABLET | Freq: Every day | ORAL | Status: AC
  Administered 2019-12-04 – 2019-12-05 (×2): 500 mg via ORAL
  Filled 2019-12-04 (×2): qty 2

## 2019-12-04 MED ORDER — IBUPROFEN 200 MG PO TABS
600.0000 mg | ORAL_TABLET | Freq: Four times a day (QID) | ORAL | Status: DC | PRN
  Administered 2019-12-04 – 2019-12-05 (×2): 600 mg via ORAL
  Filled 2019-12-04 (×2): qty 3

## 2019-12-04 NOTE — Progress Notes (Signed)
SATURATION QUALIFICATIONS: (This note is used to comply with regulatory documentation for home oxygen)  Patient Saturations on Room Air at Rest = 93%  Patient Saturations on Room Air while Ambulating = 87-93%

## 2019-12-04 NOTE — Progress Notes (Signed)
PROGRESS NOTE    Jorge Gonzalez  ZOX:[id number] DOB: 03-16-1971 DOA: 12/01/2019 PCP: Patient, [name]    Chief Complaint  Patient presents with  . Cough    Brief Narrative:  HPI per Dr. Mahala MenghiniSamtani 49 year old male PMH P A. 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 from this and contracting pneumonia as well He does not have any underlying asthma or airway issues but still smokes half pack per day for the past 20 or 30 years-he does not use inhalers nor other medications for lung issues  He has been noncompliant for while on his metoprolol because he did not have a prescription for the same and is taking aspirin 325-had seen a cardiologist locally but this is not available in our system as to who he had seen  Thought he had Covid secondary to having chills and rigors 2 to 3 days prior to coming in in addition to drenching sweats  In emergency room found to have O2 sat 84%, CXR = no active disease however CT chest showed moderate to marked severe diffuse bilateral infiltrates on x-ray  Assessment & Plan:   Principal Problem:   Acute hypoxemic respiratory failure (HCC) Active Problems:   Pneumonia of both lungs due to infectious organism   Multifocal pneumonia   PNA (pneumonia)   AF (paroxysmal atrial fibrillation) (HCC)   Tobacco abuse   AKI (acute kidney injury) (HCC)  1 acute hypoxic respiratory failure secondary to probable bacterial pneumonia/CAP Patient had presented with cough, some chills, shortness of breath, hypoxia noted with sats of 84% on room air.  COVID-19 PCR which was done was negative.  Chest x-ray concerning for bilateral pneumonia.  Patient placed on 2 L O2 with some clinical  improvement.  Leukocytosis trending down.  Afebrile.  Urine strep pneumococcus antigen negative.  Urine Legionella antigen negative.  Patient noted to have ambulatory sats of 87% on room air.  Chest x-ray with bilateral infiltrates noted.  Continue Mucinex, Flonase, Claritin, Dulera, Protonix, IV Rocephin.  Transition IV azithromycin to oral azithromycin.  Check a 2D echo.  Lasix 20 mg IV x2 doses.  Strict I's and O's.  Daily weights.  Could likely transition to oral Vantin tomorrow.  Supportive care.  2.  Paroxysmal atrial fibrillation CHA2DS2VASC score <1 Patient noted to have been noncompliant with his metoprolol due to the fact he did not have any more prescriptions for refills.  Metoprolol resumed for rate control.  Continue aspirin at 325 mg daily and could likely decrease to 81 mg on discharge.  Check a 2D echo.  Will need to follow-up with cardiology in the outpatient setting.  Follow.  3.  Tobacco abuse Tobacco cessation.  4.  Acute kidney injury Improved with hydration.  Saline lock IV fluids.   DVT prophylaxis: SCDs Code Status: Full Family Communication: Updated patient.  No family at bedside. Disposition:   Status is: Inpatient    Dispo:  Patient From: Home  Planned Disposition: Home  Expected discharge date: 12/05/19  Medically stable for discharge: No        Consultants:   None  Procedures:   CT angiogram chest 12/01/2019  Chest x-ray 12/03/2019, 12/01/2019  Antimicrobials:  IV azithromycin 12/01/2019>>>>>> oral azithromycin 12/04/2019  IV Rocephin 12/01/2019>>>>>>   Subjective: Patient sleeping but arousable.  States he continues to feel better daily.  Shortness of breath improving.  No significant chest pain.  Noted to have ambulatory sats of 87% on room air per RN.   Objective: Vitals:   12/03/19 2011 12/03/19 2028 12/04/19 0502 12/04/19 0756  BP:  134/88 125/75   Pulse:  79 78   Resp:  20 20   Temp:  97.9 F (36.6 C) 98.3 F (36.8 C)     TempSrc:  Oral Oral   SpO2: 97% 96% 97% 95%  Weight:      Height:        Intake/Output Summary (Last 24 hours) at 12/04/2019 1146 Last data filed at 12/04/2019 1141 Gross per 24 hour  Intake 830 ml  Output 850 ml  Net -20 ml   Filed Weights   12/01/19 1459 12/02/19 1530  Weight: 91.6 kg 92.9 kg    Examination:  General exam: NAD Respiratory system: Decreased breath sounds in the bases.  Some bibasilar coarse breath sounds.  Scattered crackles improving.  No wheezing.  Normal respiratory effort. Cardiovascular system: Regular rate rhythm no murmurs rubs or gallops.  No JVD.  No lower extremity edema.  Gastrointestinal system: Abdomen is soft, nontender, nondistended, positive bowel sounds.  No rebound.  No guarding.  Central nervous system: Alert and oriented. No focal neurological deficits. Extremities: Symmetric 5 x 5 power. Skin: No rashes, lesions or ulcers Psychiatry: Judgement and insight appear normal. Mood & affect appropriate.     Data Reviewed: I have personally reviewed following labs and imaging studies  CBC: Recent Labs  Lab 12/01/19 1838 12/02/19 1921 12/03/19 0913 12/04/19 0551  WBC 19.6* 15.8* 12.5* 9.4  NEUTROABS  --  13.2* 10.1* 7.2  HGB 13.0 12.8* 12.0* 11.5*  HCT 40.7 40.3 37.8* 36.8*  MCV 76.6* 76.9* 77.3* 77.3*  PLT 270 273 259 262    Basic Metabolic Panel: Recent Labs  Lab 12/01/19 1838 12/02/19 1921 12/03/19 0913 12/04/19 0551  NA 138 139 139 140  K 4.5 4.5 3.8 4.0  CL 104 105 105 110  CO2 28 23 20* 22  GLUCOSE 100* 93 165* 112*  BUN 20 14 9 10   CREATININE 1.25* 0.97 1.01 0.94  CALCIUM 8.4* 8.7* 8.3* 8.4*  MG  --   --  1.9  --     GFR: Estimated Creatinine Clearance: 104 mL/min (by C-G formula based on SCr of 0.94 mg/dL).  Liver Function Tests: Recent Labs  Lab 12/01/19 1838 12/02/19 1921  AST 39 27  ALT 28 21  ALKPHOS 71 76  BILITOT 0.5 0.5  PROT 6.5 6.8  ALBUMIN 3.6 3.6    CBG: No results for input(s): GLUCAP  in the last 168 hours.   Recent Results (from the past 240 hour(s))  SARS Coronavirus 2 by RT PCR (hospital order, performed in [facility] hospital lab) Nasopharyngeal Nasopharyngeal Swab     Status: None   Collection Time: 12/01/19  3:04 PM   Specimen: Nasopharyngeal Swab  Result Value Ref Range Status   SARS Coronavirus 2 NEGATIVE NEGATIVE Final    Comment: (NOTE) SARS-CoV-2 target nucleic acids are NOT DETECTED.  The SARS-CoV-2 RNA is generally detectable in upper and lower respiratory specimens during the acute phase of infection. The lowest concentration of SARS-CoV-2 viral copies this assay can detect is 250 copies / mL. A negative result does not preclude SARS-CoV-2 infection and should not  be used as the sole basis for treatment or other patient management decisions.  A negative result may occur with improper specimen collection / handling, submission of specimen other than nasopharyngeal swab, presence of viral mutation(s) within the areas targeted by this assay, and inadequate number of viral copies (<250 copies / mL). A negative result must be combined with clinical observations, patient history, and epidemiological information.  Fact Sheet for Patients:   BoilerBrush.com.cy  Fact Sheet for Healthcare Providers: https://pope.com/  This test is not yet approved or  cleared by the [place] FDA and has been authorized for detection and/or diagnosis of SARS-CoV-2 by FDA under an Emergency Use Authorization (EUA).  This EUA will remain in effect (meaning this test can be used) for the duration of the COVID-19 declaration under Section 564(b)(1) of the Act, 21 U.S.C. section 360bbb-3(b)(1), unless the authorization is terminated or revoked sooner.  Performed at [facility], [address] [place], [place] [address]   Culture, blood (single)     Status: None (Preliminary result)   Collection Time: 12/01/19   8:27 PM   Specimen: BLOOD  Result Value Ref Range Status   Specimen Description   Final    BLOOD RIGHT ANTECUBITAL Performed at [facility], [address] [place], [place] [address]    Special Requests   Final    BOTTLES DRAWN AEROBIC AND ANAEROBIC Blood Culture adequate volume Performed at [facility], [address] [place], [place] [address]    Culture   Final    NO GROWTH 2 DAYS Performed at [facility], [address], [place], [place] [address]    Report Status PENDING  Incomplete  Resp Panel by RT PCR (RSV, Flu A&B, Covid) - Nasopharyngeal Swab     Status: None   Collection Time: 12/02/19  1:58 PM   Specimen: Nasopharyngeal Swab  Result Value Ref Range Status   SARS Coronavirus 2 by RT PCR NEGATIVE NEGATIVE Final    Comment: (NOTE) SARS-CoV-2 target nucleic acids are NOT DETECTED.  The SARS-CoV-2 RNA is generally detectable in upper respiratoy specimens during the acute phase of infection. The lowest concentration of SARS-CoV-2 viral copies this assay can detect is 131 copies/mL. A negative result does not preclude SARS-Cov-2 infection and should not be used as the sole basis for treatment or other patient management decisions. A negative result may occur with  improper specimen collection/handling, submission of specimen other than nasopharyngeal swab, presence of viral mutation(s) within the areas targeted by this assay, and inadequate number of viral copies (<131 copies/mL). A negative result must be combined with clinical observations, patient history, and epidemiological information. The expected result is Negative.  Fact Sheet for Patients:  https://www.moore.com/  Fact Sheet for Healthcare Providers:  https://www.young.biz/  This test is no t yet approved or cleared by the [place] FDA and  has been authorized for detection and/or diagnosis of SARS-CoV-2 by FDA under an Emergency  Use Authorization (EUA). This EUA will remain  in effect (meaning this test can be used) for the duration of the COVID-19 declaration under Section 564(b)(1) of the Act, 21 U.S.C. section 360bbb-3(b)(1), unless the authorization is terminated or revoked sooner.     Influenza A by PCR NEGATIVE NEGATIVE Final   Influenza B by PCR NEGATIVE NEGATIVE Final    Comment: (NOTE) The Xpert Xpress SARS-CoV-2/FLU/RSV assay is intended as an aid in  the diagnosis of influenza from Nasopharyngeal swab specimens  and  should not be used as a sole basis for treatment. Nasal washings and  aspirates are unacceptable for Xpert Xpress SARS-CoV-2/FLU/RSV  testing.  Fact Sheet for Patients: https://www.moore.com/  Fact Sheet for Healthcare Providers: https://www.young.biz/  This test is not yet approved or cleared by the [place] FDA and  has been authorized for detection and/or diagnosis of SARS-CoV-2 by  FDA under an Emergency Use Authorization (EUA). This EUA will remain  in effect (meaning this test can be used) for the duration of the  Covid-19 declaration under Section 564(b)(1) of the Act, 21  U.S.C. section 360bbb-3(b)(1), unless the authorization is  terminated or revoked.    Respiratory Syncytial Virus by PCR NEGATIVE NEGATIVE Final    Comment: (NOTE) Fact Sheet for Patients: https://www.moore.com/  Fact Sheet for Healthcare Providers: https://www.young.biz/  This test is not yet approved or cleared by the [place] FDA and  has been authorized for detection and/or diagnosis of SARS-CoV-2 by  FDA under an Emergency Use Authorization (EUA). This EUA will remain  in effect (meaning this test can be used) for the duration of the  COVID-19 declaration under Section 564(b)(1) of the Act, 21 U.S.C.  section 360bbb-3(b)(1), unless the authorization is terminated or  revoked. Performed at [facility], [address] [place], [place] [address]          Radiology Studies: DG Chest 2 View  Result Date: 12/03/2019 CLINICAL DATA:  Pneumonia EXAM: CHEST - 2 VIEW COMPARISON:  12/01/2019 CTA chest and the chest radiograph. FINDINGS: Diffuse bilateral interstitial opacities. Hypoinflated lungs. No pneumothorax or pleural effusion. Cardiomediastinal silhouette is unchanged. No acute osseous abnormality. IMPRESSION: Diffuse interstitial opacities concerning for infection and superimposed edema. Electronically Signed   By: Stana Bunting M.D.   On: 12/03/2019 10:43        Scheduled Meds: . aspirin  325 mg Oral Daily  . azithromycin  500 mg Oral Daily  . fluticasone  2 spray Each Nare Daily  . guaiFENesin  1,200 mg Oral BID  . ipratropium  0.5 mg Nebulization BID  . levalbuterol  0.63 mg Nebulization BID  . loratadine  10 mg Oral Daily  . metoprolol tartrate  25 mg Oral BID  . mometasone-formoterol  2 puff Inhalation BID  . pantoprazole  40 mg Oral Q0600   Continuous Infusions: . cefTRIAXone (ROCEPHIN)  IV Stopped (12/03/19 2030)     LOS: 2 days    Time spent: 35 minutes    Ramiro Harvest, MD [facility]   To contact the attending provider between 7A-7P or the covering provider during after hours 7P-7A, please log into the web site [email or web address] and access using universal [place] password for that web site. If you do not have the password, please call the hospital operator.  12/04/2019, 11:46 AM

## 2019-12-05 ENCOUNTER — Inpatient Hospital Stay (HOSPITAL_COMMUNITY): Payer: Self-pay

## 2019-12-05 DIAGNOSIS — I351 Nonrheumatic aortic (valve) insufficiency: Secondary | ICD-10-CM

## 2019-12-05 LAB — CBC WITH DIFFERENTIAL/PLATELET
Abs Immature Granulocytes: 0.03 10*3/uL (ref 0.00–0.07)
Basophils Absolute: 0 10*3/uL (ref 0.0–0.1)
Basophils Relative: 0 %
Eosinophils Absolute: 0.4 10*3/uL (ref 0.0–0.5)
Eosinophils Relative: 4 %
HCT: 37.5 % — ABNORMAL LOW (ref 39.0–52.0)
Hemoglobin: 11.7 g/dL — ABNORMAL LOW (ref 13.0–17.0)
Immature Granulocytes: 0 %
Lymphocytes Relative: 17 %
Lymphs Abs: 1.4 10*3/uL (ref 0.7–4.0)
MCH: 24.1 pg — ABNORMAL LOW (ref 26.0–34.0)
MCHC: 31.2 g/dL (ref 30.0–36.0)
MCV: 77.3 fL — ABNORMAL LOW (ref 80.0–100.0)
Monocytes Absolute: 0.7 10*3/uL (ref 0.1–1.0)
Monocytes Relative: 8 %
Neutro Abs: 6.1 10*3/uL (ref 1.7–7.7)
Neutrophils Relative %: 71 %
Platelets: 303 10*3/uL (ref 150–400)
RBC: 4.85 MIL/uL (ref 4.22–5.81)
RDW: 15.8 % — ABNORMAL HIGH (ref 11.5–15.5)
WBC: 8.6 10*3/uL (ref 4.0–10.5)
nRBC: 0.2 % (ref 0.0–0.2)

## 2019-12-05 LAB — ECHOCARDIOGRAM COMPLETE
Area-P 1/2: 3.6 cm2
Height: 68 in
P 1/2 time: 458 msec
S' Lateral: 3.8 cm
Weight: 3301.61 oz

## 2019-12-05 LAB — BASIC METABOLIC PANEL
Anion gap: 7 (ref 5–15)
BUN: 12 mg/dL (ref 6–20)
CO2: 24 mmol/L (ref 22–32)
Calcium: 8.2 mg/dL — ABNORMAL LOW (ref 8.9–10.3)
Chloride: 106 mmol/L (ref 98–111)
Creatinine, Ser: 1.01 mg/dL (ref 0.61–1.24)
GFR calc Af Amer: >60 mL/min (ref >60)
GFR calc non Af Amer: >60 mL/min (ref >60)
Glucose, Bld: 111 mg/dL — ABNORMAL HIGH (ref 70–99)
Potassium: 3.7 mmol/L (ref 3.5–5.1)
Sodium: 137 mmol/L (ref 135–145)

## 2019-12-05 LAB — PROCALCITONIN: Procalcitonin: 0.65 ng/mL

## 2019-12-05 LAB — MAGNESIUM: Magnesium: 2.1 mg/dL (ref 1.7–2.4)

## 2019-12-05 MED ORDER — CEFDINIR 300 MG PO CAPS: 300.0000 mg | ORAL_CAPSULE | Freq: Two times a day (BID) | ORAL | 0 refills | Status: AC

## 2019-12-05 MED ORDER — PANTOPRAZOLE SODIUM 40 MG PO TBEC: 40.0000 mg | DELAYED_RELEASE_TABLET | Freq: Every day | ORAL | 1 refills | Status: AC

## 2019-12-05 MED ORDER — METOPROLOL TARTRATE 25 MG PO TABS: 25.0000 mg | ORAL_TABLET | Freq: Two times a day (BID) | ORAL | 2 refills | Status: AC

## 2019-12-05 MED ORDER — ASPIRIN EC 81 MG PO TBEC: 81.0000 mg | DELAYED_RELEASE_TABLET | Freq: Every day | ORAL | 0 refills | Status: AC

## 2019-12-05 MED ORDER — CEFDINIR 300 MG PO CAPS
300.0000 mg | ORAL_CAPSULE | Freq: Two times a day (BID) | ORAL | Status: DC
  Administered 2019-12-05: 300 mg via ORAL
  Filled 2019-12-05: qty 1

## 2019-12-05 MED ORDER — FLUTICASONE PROPIONATE 50 MCG/ACT NA SUSP: 2.0000 | Freq: Every day | NASAL | 0 refills | Status: AC

## 2019-12-05 MED ORDER — GUAIFENESIN ER 600 MG PO TB12: 1200.0000 mg | ORAL_TABLET | Freq: Two times a day (BID) | ORAL | 0 refills | Status: AC

## 2019-12-05 MED ORDER — LORATADINE 10 MG PO TABS: 10.0000 mg | ORAL_TABLET | Freq: Every day | ORAL | 0 refills | Status: AC

## 2019-12-05 MED ORDER — LEVALBUTEROL TARTRATE 45 MCG/ACT IN AERO: 2.0000 | INHALATION_SPRAY | Freq: Three times a day (TID) | RESPIRATORY_TRACT | 1 refills | Status: AC

## 2019-12-05 NOTE — Progress Notes (Signed)
Walking oxygen saturation test completed. Patient's oxygen saturation maintained 97-98% on room air while ambulating the hall. Patient denies any shortness of breath.

## 2019-12-05 NOTE — Discharge Summary (Signed)
Physician Discharge Summary  Jorge Gonzalez NWG:[id number] DOB: [birth year] DOA: 12/01/2019  PCP: Patient, [name]  Admit date: 12/01/2019 Discharge date: 12/05/2019  Time spent: 55 minutes  Recommendations for Outpatient Follow-up:  1. Follow-up with PCP in 2 weeks. On follow-up patient's pneumonia will need to be reassessed. Patient will need a basic metabolic profile done to follow-up on electrolytes and renal function.   Discharge Diagnoses:  Principal Problem:   Acute hypoxemic respiratory failure (HCC) Active Problems:   Pneumonia of both lungs due to infectious organism   Multifocal pneumonia   PNA (pneumonia)   AF (paroxysmal atrial fibrillation) (HCC)   Tobacco abuse   AKI (acute kidney injury) [facility])   Discharge Condition: Stable and improved  Diet recommendation: Heart healthy  Filed Weights   12/02/19 1530 12/04/19 1150 12/05/19 0513  Weight: 92.9 kg 92.8 kg 93.6 kg    History of present illness:  HPI per Dr. Mahala Gonzalez 49 year old male PMH P A. 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 from this and contracting pneumonia as well He does not have any underlying asthma or airway issues but still smokes half pack per day for the past 20 or 30 years-he does not use inhalers nor other medications for lung issues  He has been noncompliant for while on his metoprolol because he did not have a prescription for the same and is taking aspirin 325-had seen a cardiologist locally but this is not available in our system as to who he had seen  Thought he had Covid secondary to having chills and rigors 2 to 3 days prior to coming in in addition to drenching sweats  In emergency room found to have  O2 sat 84%, CXR = no active disease however CT chest showed moderate to marked severe diffuse bilateral infiltrates on x-ray  Review of Systems:    Pertinent +'s: Chills Reiger fever Pertinent -"s: Nausea vomiting upper abdominal pain diarrhea myalgia blurred vision double vision unilateral weakness loss of smell taste dark stool tarry stool reflux-like symptoms seizure  ED Course: Started goal-directed ceftriaxone azithromycin sepsis protocol  Given Tylenol given ibuprofen obtain Legionella HIV respiratory viral panel Sputum ordered.  Hospital Course:  1 acute hypoxic respiratory failure secondary to probable bacterial pneumonia/CAP Patient had presented with cough, some chills, shortness of breath, hypoxia noted with sats of 84% on room air.  COVID-19 PCR which was done was negative.  Chest x-ray concerning for bilateral pneumonia.  Patient placed on 2 L O2, IV antibiotics with clinical improvement.  Leukocytosis trended down and had resolved by day of discharge. Patient remained afebrile. Urine strep pneumococcus antigen negative.  Urine Legionella antigen negative.  Patient noted to have ambulatory sats of 87% on room air on 12/04/2019.  Chest x-ray with bilateral infiltrates noted. Patient was maintained on Mucinex, Flonase, Claritin, Dulera, PPI, IV Rocephin and IV azithromycin. IV azithromycin was subsequently transition to oral azithromycin. Patient received Lasix 20 mg IV x2 doses with urine output of  3.3 L. 2D echo which was done had a normal EF, no wall motion abnormalities. Patient subsequently transition to oral Vantin. Patient is ambulatory sats were 97% on room air on ambulation by day of discharge. Patient  will be discharged home on three more days of oral Vantin to complete a 7-day course of antibiotic treatment. Outpatient follow-up with PCP.  2.  Paroxysmal atrial fibrillation CHA2DS2VASC score <1 Patient noted to have been noncompliant with his metoprolol due to the fact he  did not have any more prescriptions for refills.  Metoprolol resumed for rate control.   Patient maintained on aspirin 325 mg daily and be discharged on aspirin 81 mg daily. 2D echo was obtained with normal EF, no wall motion abnormalities. Patient will be given prescriptions for metoprolol on discharge. Outpatient follow-up with PCP.  3.  Tobacco abuse Tobacco cessation stressed to patient.  4.  Acute kidney injury Resolved with hydration initially. Patient received Lasix 20 mg IV x2 doses due to concerns for possible volume overload. Renal function remained stable. Outpatient follow-up with PCP.    Procedures:  CT angiogram chest 12/01/2019  Chest x-ray 12/03/2019, 12/01/2019  2D echo 12/05/2019  Consultations:  None  Discharge Exam: Vitals:   12/05/19 1034 12/05/19 1440  BP: 125/88 122/88  Pulse: 90 68  Resp:  16  Temp:  97.8 F (36.6 C)  SpO2: 97% 97%    General: NAD Cardiovascular: RRR Respiratory: CTAB  Discharge Instructions   Discharge Instructions    Diet - low sodium heart healthy   Complete by: As directed    Increase activity slowly   Complete by: As directed      Allergies as of 12/05/2019   No Known Allergies     Medication List    TAKE these medications   aspirin EC 81 MG tablet Take 1 tablet (81 mg total) by mouth daily. Swallow whole.   cefdinir 300 MG capsule Commonly known as: OMNICEF Take 1 capsule (300 mg total) by mouth every 12 (twelve) hours for 3 days.   fluticasone 50 MCG/ACT nasal spray Commonly known as: FLONASE Place 2 sprays into both nostrils daily. Start taking on: December 06, 2019   guaiFENesin 600 MG 12 hr tablet Commonly known as: MUCINEX Take 2 tablets (1,200 mg total) by mouth 2 (two) times daily for 3 days.   levalbuterol 45 MCG/ACT inhaler Commonly known as: XOPENEX HFA Inhale 2 puffs into the lungs 3 (three) times daily for 5 days. Use 3 times daily x 5 days, then every 6 hours as needed.   loratadine 10  MG tablet Commonly known as: CLARITIN Take 1 tablet (10 mg total) by mouth daily. Start taking on: December 06, 2019   metoprolol tartrate 25 MG tablet Commonly known as: LOPRESSOR Take 1 tablet (25 mg total) by mouth 2 (two) times daily.   pantoprazole 40 MG tablet Commonly known as: PROTONIX Take 1 tablet (40 mg total) by mouth daily at 6 (six) AM. Start taking on: December 06, 2019      No Known Allergies  Follow-up Information    PCP. Schedule an appointment as soon as possible for a visit in 2 week(s).                The results of significant diagnostics from this hospitalization (including imaging, microbiology, ancillary and laboratory) are listed below for reference.    Significant Diagnostic Studies: DG Chest 2 View  Result Date: 12/03/2019 CLINICAL DATA:  Pneumonia EXAM: CHEST - 2 VIEW COMPARISON:  12/01/2019 CTA chest and the chest radiograph. FINDINGS: Diffuse bilateral interstitial opacities. Hypoinflated lungs. No pneumothorax or pleural effusion. Cardiomediastinal silhouette is unchanged. No acute osseous abnormality. IMPRESSION: Diffuse interstitial opacities concerning for infection and  superimposed edema. Electronically Signed   By: Stana Bunting M.D.   On: 12/03/2019 10:43   CT ANGIO CHEST PE W OR WO CONTRAST  Result Date: 12/01/2019 CLINICAL DATA:  Cough and fever with shortness of breath. EXAM: CT ANGIOGRAPHY CHEST WITH CONTRAST TECHNIQUE: Multidetector CT imaging of the chest was performed using the standard protocol during bolus administration of intravenous contrast. Multiplanar CT image reconstructions and MIPs were obtained to evaluate the vascular anatomy. CONTRAST:  OMNIPAQUE IOHEXOL 350 MG/ML SOLN COMPARISON:  None. FINDINGS: Cardiovascular: Satisfactory opacification of the pulmonary arteries to the segmental level. No evidence of pulmonary embolism. Normal heart size. No pericardial effusion. Mediastinum/Nodes: There is very mild left  hilar lymphadenopathy. Thyroid gland, trachea, and esophagus demonstrate no significant findings. Lungs/Pleura: Moderate to marked severity diffuse bilateral infiltrates are seen. There is no evidence of a pleural effusion or pneumothorax. Upper Abdomen: No acute abnormality. Musculoskeletal: No chest wall abnormality. No acute or significant osseous findings. Review of the MIP images confirms the above findings. IMPRESSION: Moderate to marked severity diffuse bilateral infiltrates. Electronically Signed   By: Aram Candela M.D.   On: 12/01/2019 19:36   DG Chest Port 1 View  Result Date: 12/01/2019 CLINICAL DATA:  Shortness of breath. EXAM: PORTABLE CHEST 1 VIEW COMPARISON:  None. FINDINGS: The heart size and mediastinal contours are within normal limits. Both lungs are clear. No pneumothorax or pleural effusion is noted. The visualized skeletal structures are unremarkable. IMPRESSION: No active disease. Electronically Signed   By: Lupita Raider M.D.   On: 12/01/2019 18:26   ECHOCARDIOGRAM COMPLETE  Result Date: 12/05/2019    ECHOCARDIOGRAM REPORT   Patient Name:   Jorge Gonzalez Date of Exam: 12/05/2019 Medical Rec #:  [id number]   Height:       68.0 in Accession #:    [id number]  Weight:       206.3 lb Date of Birth:  [birth year]  BSA:          2.071 m Patient Age:    48 years    BP:           129/91 mmHg Patient Gender: M           HR:           71 bpm. Exam Location:  Inpatient Procedure: 2D Echo Indications:    Dyspnea R06.00  History:        Patient has no prior history of Echocardiogram examinations.                 Arrythmias:Atrial Fibrillation.  Sonographer:    Thurman Coyer RDCS (AE) Referring Phys: [id number] Naitik Hermann V Demonte Dobratz IMPRESSIONS  1. Left ventricular ejection fraction, by estimation, is 55 to 60%. The left ventricle has normal function. The left ventricle has no regional wall motion abnormalities. Left ventricular diastolic parameters were normal.  2. Right ventricular systolic function  is normal. The right ventricular size is normal. Tricuspid regurgitation signal is inadequate for assessing PA pressure.  3. The mitral valve is grossly normal. Trivial mitral valve regurgitation. No evidence of mitral stenosis.  4. The aortic valve is tricuspid. There is mild calcification of the aortic valve. Aortic valve regurgitation is mild. Mild aortic valve sclerosis is present, with no evidence of aortic valve stenosis.  5. The inferior vena cava is normal in size with greater than 50% respiratory variability, suggesting right atrial pressure of 3 mmHg. FINDINGS  Left Ventricle: Left ventricular ejection fraction, by estimation, is 55 to  60%. The left ventricle has normal function. The left ventricle has no regional wall motion abnormalities. The left ventricular internal cavity size was normal in size. There is  no left ventricular hypertrophy. Left ventricular diastolic parameters were normal. Right Ventricle: The right ventricular size is normal. No increase in right ventricular wall thickness. Right ventricular systolic function is normal. Tricuspid regurgitation signal is inadequate for assessing PA pressure. Left Atrium: Left atrial size was normal in size. Right Atrium: Right atrial size was normal in size. Pericardium: Trivial pericardial effusion is present. Presence of pericardial fat pad. Mitral Valve: The mitral valve is grossly normal. Trivial mitral valve regurgitation. No evidence of mitral valve stenosis. Tricuspid Valve: The tricuspid valve is grossly normal. Tricuspid valve regurgitation is not demonstrated. No evidence of tricuspid stenosis. Aortic Valve: The aortic valve is tricuspid. There is mild calcification of the aortic valve. Aortic valve regurgitation is mild. Aortic regurgitation PHT measures 458 msec. Mild aortic valve sclerosis is present, with no evidence of aortic valve stenosis. Pulmonic Valve: The pulmonic valve was grossly normal. Pulmonic valve regurgitation is not  visualized. No evidence of pulmonic stenosis. Aorta: The aortic root and ascending aorta are structurally normal, with no evidence of dilitation. Venous: The inferior vena cava is normal in size with greater than 50% respiratory variability, suggesting right atrial pressure of 3 mmHg. IAS/Shunts: The atrial septum is grossly normal. EKG: Rhythm strip during this exam demostrated normal sinus rhythm.  LEFT VENTRICLE PLAX 2D LVIDd:         5.20 cm  Diastology LVIDs:         3.80 cm  LV e' medial:    7.01 cm/s LV PW:         1.10 cm  LV E/e' medial:  10.2 LV IVS:        1.10 cm  LV e' lateral:   6.48 cm/s LVOT diam:     2.10 cm  LV E/e' lateral: 11.1 LV SV:         80 LV SV Index:   39 LVOT Area:     3.46 cm  RIGHT VENTRICLE RV S prime:     8.41 cm/s TAPSE (M-mode): 1.6 cm LEFT ATRIUM             Index       RIGHT ATRIUM           Index LA diam:        3.00 cm 1.45 cm/m  RA Area:     14.40 cm LA Vol (A2C):   50.9 ml 24.58 ml/m RA Volume:   34.80 ml  16.80 ml/m LA Vol (A4C):   38.8 ml 18.73 ml/m LA Biplane Vol: 45.2 ml 21.82 ml/m  AORTIC VALVE LVOT Vmax:   113.00 cm/s LVOT Vmean:  80.700 cm/s LVOT VTI:    0.232 m AI PHT:      458 msec MITRAL VALVE MV Area (PHT): 3.60 cm    SHUNTS MV Decel Time: 211 msec    Systemic VTI:  0.23 m MV E velocity: 71.70 cm/s  Systemic Diam: 2.10 cm MV A velocity: 87.00 cm/s MV E/A ratio:  0.82 Lennie Odor MD Electronically signed by Lennie Odor MD Signature Date/Time: 12/05/2019/10:28:17 AM    Final     Microbiology: Recent Results (from the past 240 hour(s))  SARS Coronavirus 2 by RT PCR (hospital order, performed in [facility] hospital lab) Nasopharyngeal Nasopharyngeal Swab     Status: None   Collection Time: 12/01/19  3:04 PM   Specimen: Nasopharyngeal Swab  Result Value Ref Range Status   SARS Coronavirus 2 NEGATIVE NEGATIVE Final    Comment: (NOTE) SARS-CoV-2 target nucleic acids are NOT DETECTED.  The SARS-CoV-2 RNA is generally detectable in upper and  lower respiratory specimens during the acute phase of infection. The lowest concentration of SARS-CoV-2 viral copies this assay can detect is 250 copies / mL. A negative result does not preclude SARS-CoV-2 infection and should not be used as the sole basis for treatment or other patient management decisions.  A negative result may occur with improper specimen collection / handling, submission of specimen other than nasopharyngeal swab, presence of viral mutation(s) within the areas targeted by this assay, and inadequate number of viral copies (<250 copies / mL). A negative result must be combined with clinical observations, patient history, and epidemiological information.  Fact Sheet for Patients:   BoilerBrush.com.cyhttps://www.fda.gov/media/136312/download  Fact Sheet for Healthcare Providers: https://pope.com/https://www.fda.gov/media/136313/download  This test is not yet approved or  cleared by the [place] FDA and has been authorized for detection and/or diagnosis of SARS-CoV-2 by FDA under an Emergency Use Authorization (EUA).  This EUA will remain in effect (meaning this test can be used) for the duration of the COVID-19 declaration under Section 564(b)(1) of the Act, 21 U.S.C. section 360bbb-3(b)(1), unless the authorization is terminated or revoked sooner.  Performed at [facility] [place], [address] [place], [place] [address]   Culture, blood (single)     Status: None (Preliminary result)   Collection Time: 12/01/19  8:27 PM   Specimen: BLOOD  Result Value Ref Range Status   Specimen Description   Final    BLOOD RIGHT ANTECUBITAL Performed at [facility] [place], [address] [place], [place] [address]    Special Requests   Final    BOTTLES DRAWN AEROBIC AND ANAEROBIC Blood Culture adequate volume Performed at [facility] [place], [address] [place], [place] [address]    Culture   Final    NO GROWTH 4 DAYS Performed at [facility], [address]  [place], [place] [address]    Report Status PENDING  Incomplete  Resp Panel by RT PCR (RSV, Flu A&B, Covid) - Nasopharyngeal Swab     Status: None   Collection Time: 12/02/19  1:58 PM   Specimen: Nasopharyngeal Swab  Result Value Ref Range Status   SARS Coronavirus 2 by RT PCR NEGATIVE NEGATIVE Final    Comment: (NOTE) SARS-CoV-2 target nucleic acids are NOT DETECTED.  The SARS-CoV-2 RNA is generally detectable in upper respiratoy specimens during the acute phase of infection. The lowest concentration of SARS-CoV-2 viral copies this assay can detect is 131 copies/mL. A negative result does not preclude SARS-Cov-2 infection and should not be used as the sole basis for treatment or other patient management decisions. A negative result may occur with  improper specimen collection/handling, submission of specimen other than nasopharyngeal swab, presence of viral mutation(s) within the areas targeted by this assay, and inadequate number of viral copies (<131 copies/mL). A negative result must be combined with clinical observations, patient history, and epidemiological information. The expected result is Negative.  Fact Sheet for Patients:  https://www.moore.com/https://www.fda.gov/media/142436/download  Fact Sheet for Healthcare Providers:  https://www.young.biz/https://www.fda.gov/media/142435/download  This test is no t yet approved or cleared by the [place] FDA and  has been authorized for detection and/or diagnosis of SARS-CoV-2 by FDA under an Emergency Use Authorization (EUA). This EUA will remain  in effect (meaning this test can be used) for the duration of the COVID-19 declaration under Section 564(b)(1) of the Act, 21 U.S.C. section 360bbb-3(b)(1), unless the authorization is terminated or revoked sooner.     Influenza A by PCR NEGATIVE NEGATIVE Final   Influenza B by PCR NEGATIVE NEGATIVE Final    Comment: (NOTE) The Xpert Xpress SARS-CoV-2/FLU/RSV assay is intended as an aid in  the diagnosis of influenza from  Nasopharyngeal swab specimens and  should not be used as a sole basis for treatment. Nasal washings and  aspirates are unacceptable for Xpert Xpress SARS-CoV-2/FLU/RSV  testing.  Fact Sheet for Patients: https://www.moore.com/  Fact Sheet for Healthcare Providers: https://www.young.biz/  This test is not yet approved or cleared by the [place] FDA and  has been authorized for detection and/or diagnosis of SARS-CoV-2 by  FDA under an Emergency Use Authorization (EUA). This EUA will remain  in effect (meaning this test can be used) for the duration of the  Covid-19 declaration under Section 564(b)(1) of the Act, 21  U.S.C. section 360bbb-3(b)(1), unless the authorization is  terminated or revoked.    Respiratory Syncytial Virus by PCR NEGATIVE NEGATIVE Final    Comment: (NOTE) Fact Sheet for Patients: https://www.moore.com/  Fact Sheet for Healthcare Providers: https://www.young.biz/  This test is not yet approved or cleared by the [place] FDA and  has been authorized for detection and/or diagnosis of SARS-CoV-2 by  FDA under an Emergency Use Authorization (EUA). This EUA will remain  in effect (meaning this test can be used) for the duration of the  COVID-19 declaration under Section 564(b)(1) of the Act, 21 U.S.C.  section 360bbb-3(b)(1), unless the authorization is terminated or  revoked. Performed at [facility], [address] [place], [place] [address]      Labs: Basic Metabolic Panel: Recent Labs  Lab 12/01/19 1838 12/02/19 1921 12/03/19 0913 12/04/19 0551 12/05/19 0553  NA 138 139 139 140 137  K 4.5 4.5 3.8 4.0 3.7  CL 104 105 105 110 106  CO2 28 23 20* 22 24  GLUCOSE 100* 93 165* 112* 111*  BUN CREATININE 1.25* 0.97 1.01 0.94 1.01  CALCIUM 8.4* 8.7* 8.3* 8.4* 8.2*  MG  --   --  1.9  --  2.1   Liver Function Tests: Recent Labs  Lab  12/01/19 1838 12/02/19 1921  AST 39 27  ALT 28 21  ALKPHOS 71 76  BILITOT 0.5 0.5  PROT 6.5 6.8  ALBUMIN 3.6 3.6   No results for input(s): LIPASE, AMYLASE in the last 168 hours. No results for input(s): AMMONIA in the last 168 hours. CBC: Recent Labs  Lab 12/01/19 1838 12/02/19 1921 12/03/19 0913 12/04/19 0551 12/05/19 0553  WBC 19.6* 15.8* 12.5* 9.4 8.6  NEUTROABS  --  13.2* 10.1* 7.2 6.1  HGB 13.0 12.8* 12.0* 11.5* 11.7*  HCT 40.7 40.3 37.8* 36.8* 37.5*  MCV 76.6* 76.9* 77.3* 77.3* 77.3*  PLT 270 273 259 262 303   Cardiac Enzymes: No results for input(s): CKTOTAL, CKMB, CKMBINDEX, TROPONINI in the last 168 hours. BNP: BNP (last 3 results) Recent Labs    12/01/19 1838 12/04/19 1604  BNP 73.1 66.8    ProBNP (last 3 results) No results for input(s): PROBNP in the last 8760 hours.  CBG: No results for input(s): GLUCAP in the last 168 hours.     Signed:  Ramiro Harvest MD.  [facility] 12/05/2019, 2:50 PM

## 2019-12-06 LAB — CULTURE, BLOOD (SINGLE)
Culture: NO GROWTH
Special Requests: ADEQUATE

## 2021-01-18 ENCOUNTER — Encounter (HOSPITAL_BASED_OUTPATIENT_CLINIC_OR_DEPARTMENT_OTHER): Payer: Self-pay

## 2021-01-18 ENCOUNTER — Other Ambulatory Visit: Payer: Self-pay

## 2021-01-18 ENCOUNTER — Emergency Department (HOSPITAL_BASED_OUTPATIENT_CLINIC_OR_DEPARTMENT_OTHER)
Admission: EM | Admit: 2021-01-18 | Discharge: 2021-01-19 | Disposition: A | Discharge status: Home / Self Care | Payer: Managed Care, Other (non HMO) | Attending: Emergency Medicine | Admitting: Emergency Medicine

## 2021-01-18 ENCOUNTER — Emergency Department (HOSPITAL_BASED_OUTPATIENT_CLINIC_OR_DEPARTMENT_OTHER): Payer: Managed Care, Other (non HMO)

## 2021-01-18 DIAGNOSIS — F17210 Nicotine dependence, cigarettes, uncomplicated: Secondary | ICD-10-CM | POA: Diagnosis not present

## 2021-01-18 DIAGNOSIS — Y990 Civilian activity done for income or pay: Secondary | ICD-10-CM | POA: Diagnosis not present

## 2021-01-18 DIAGNOSIS — W231XXA Caught, crushed, jammed, or pinched between stationary objects, initial encounter: Secondary | ICD-10-CM | POA: Diagnosis not present

## 2021-01-18 DIAGNOSIS — Z23 Encounter for immunization: Secondary | ICD-10-CM | POA: Insufficient documentation

## 2021-01-18 DIAGNOSIS — S62637B Displaced fracture of distal phalanx of left little finger, initial encounter for open fracture: Secondary | ICD-10-CM | POA: Diagnosis not present

## 2021-01-18 DIAGNOSIS — S61317A Laceration without foreign body of left little finger with damage to nail, initial encounter: Secondary | ICD-10-CM

## 2021-01-18 DIAGNOSIS — S6992XA Unspecified injury of left wrist, hand and finger(s), initial encounter: Secondary | ICD-10-CM | POA: Diagnosis present

## 2021-01-18 DIAGNOSIS — S62639B Displaced fracture of distal phalanx of unspecified finger, initial encounter for open fracture: Secondary | ICD-10-CM

## 2021-01-18 MED ORDER — CEPHALEXIN 500 MG PO CAPS: 500.0000 mg | ORAL_CAPSULE | Freq: Three times a day (TID) | ORAL | 0 refills | Status: AC

## 2021-01-18 MED ORDER — TETANUS-DIPHTH-ACELL PERTUSSIS 5-2.5-18.5 LF-MCG/0.5 IM SUSY
0.5000 mL | PREFILLED_SYRINGE | Freq: Once | INTRAMUSCULAR | Status: AC
  Administered 2021-01-18: 0.5 mL via INTRAMUSCULAR
  Filled 2021-01-18: qty 0.5

## 2021-01-18 MED ORDER — LIDOCAINE HCL (PF) 1 % IJ SOLN
10.0000 mL | Freq: Once | INTRAMUSCULAR | Status: AC
  Administered 2021-01-18: 10 mL
  Filled 2021-01-18: qty 10

## 2021-01-18 NOTE — Discharge Instructions (Signed)
Take antibiotics as prescribed.  Take the entire course. Use Tylenol or ibuprofen as needed for pain. Use ice to help with pain and swelling. Keep your finger elevated to help with swelling and pain. Use the finger splint for the next 2 weeks. 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 Wash the finger with soap and water daily.  Have caution, the nail that is in place may move, so wash it gently. Return to the emergency room with any new, worsening, concerning symptoms.

## 2021-01-18 NOTE — ED Triage Notes (Signed)
Pt got finger caught in a tool and left 5th fingernail got caught, laceration across nail and into fingertip noted. Also states index finger of right hand has been swollen since hyperextending it a week ago. Last tetanus >5 years

## 2021-01-18 NOTE — ED Provider Notes (Signed)
[facility] EMERGENCY DEPARTMENT Provider Note   CSN: [id number] Arrival date & time: 01/18/21  1817     History Chief Complaint  Patient presents with   Finger Injury    Jorge Gonzalez is a 50 y.o. male presenting for evaluation of finger injury.  Patient states today at work they were using a printer when it fell, and injured his left pinky finger.  He reports acute onset pain and bleeding.  He washed the area soap and water.  He does not know when his last tetanus shot was, thinks he has been more than 5 years.  No numbness or tingling.  He has no other medical problems, takes no medications daily.  Not immunocompromised or on blood thinners.  Additionally, he states he has had pain to his right index finger for the past week after hyperextending it when hitting it on a desk.  He has not taken anything for it.  Pain is worse with movement and palpation.  Nothing makes it better.  No numbness or tingling  HPI     Past Medical History:  Diagnosis Date   Afib [facility])     Patient Active Problem List   Diagnosis Date Noted   Acute hypoxemic respiratory failure (HCC)    AF (paroxysmal atrial fibrillation) (HCC)    Tobacco abuse    AKI (acute kidney injury) (HCC)    Multifocal pneumonia 12/02/2019   PNA (pneumonia) 12/02/2019   Pneumonia of both lungs due to infectious organism 12/01/2019    History reviewed. No pertinent surgical history.     History reviewed. No pertinent family history.  Social History   Tobacco Use   Smoking status: Every Day    Packs/day: 0.50    Types: Cigarettes   Smokeless tobacco: Never  Vaping Use   Vaping Use: Never used  Substance Use Topics   Alcohol use: Never   Drug use: Never    Home Medications Prior to Admission medications   Medication Sig Start Date End Date Taking? Authorizing Provider  cephALEXin (KEFLEX) 500 MG capsule Take 1 capsule (500 mg total) by mouth 3 (three) times daily for 5 days. 01/18/21 01/23/21 Yes  Patrina Andreas, PA-C  fluticasone (FLONASE) 50 MCG/ACT nasal spray Place 2 sprays into both nostrils daily. 12/06/19   Rodolph Bong, MD  levalbuterol [facility] HFA) 45 MCG/ACT inhaler Inhale 2 puffs into the lungs 3 (three) times daily for 5 days. Use 3 times daily x 5 days, then every 6 hours as needed. 12/05/19 12/10/19  Rodolph Bong, MD  loratadine (CLARITIN) 10 MG tablet Take 1 tablet (10 mg total) by mouth daily. 12/06/19   Rodolph Bong, MD  metoprolol tartrate (LOPRESSOR) 25 MG tablet Take 1 tablet (25 mg total) by mouth 2 (two) times daily. 12/05/19   Rodolph Bong, MD  pantoprazole (PROTONIX) 40 MG tablet Take 1 tablet (40 mg total) by mouth daily at 6 (six) AM. 12/06/19   Rodolph Bong, MD    Allergies    Patient has no known allergies.  Review of Systems   Review of Systems  Musculoskeletal:  Positive for joint swelling.  Skin:  Positive for wound.   Physical Exam Updated Vital Signs BP (!) 150/99   Pulse 78   Temp 98.6 F (37 C) (Oral)   Resp 15   Ht 5\' 8"  (1.727 m)   Wt 88.5 kg   SpO2 100%   BMI 29.65 kg/m   Physical Exam Vitals and nursing note  reviewed.  Constitutional:      General: He is not in acute distress.    Appearance: He is well-developed.  HENT:     Head: Normocephalic and atraumatic.  Eyes:     Extraocular Movements: Extraocular movements intact.  Cardiovascular:     Rate and Rhythm: Normal rate.  Pulmonary:     Effort: Pulmonary effort is normal.  Abdominal:     General: There is no distension.  Musculoskeletal:        General: Tenderness present. Normal range of motion.     Cervical back: Normal range of motion.     Comments: Right index finger mildly swollen, mostly around the PIP.  Full active range of motion and good distal sensation and cap refill. Left pinky finger with a transverse laceration through the nail and nailbed.  Full active range of motion of the pinky at each joint when held in isolation.  Good distal  sensation and cap refill.  Skin:    General: Skin is warm.     Findings: No rash.  Neurological:     Mental Status: He is alert and oriented to person, place, and time.    ED Results / Procedures / Treatments   Labs (all labs ordered are listed, but only abnormal results are displayed) Labs Reviewed - No data to display  EKG None  Radiology DG Finger Index Right  Result Date: 01/18/2021 CLINICAL DATA:  Trauma to the right index finger. EXAM: RIGHT INDEX FINGER 2+V COMPARISON:  None. FINDINGS: There is no acute fracture or dislocation the bones are well mineralized. No arthritic changes. Mild soft tissue swelling of the index finger. No radiopaque foreign object or soft tissue gas. IMPRESSION: No acute fracture or dislocation. Electronically Signed   By: Elgie Collard M.D.   On: 01/18/2021 19:11   DG Finger Little Left  Result Date: 01/18/2021 CLINICAL DATA:  Trauma to the left fifth digit. EXAM: LEFT LITTLE FINGER 2+V COMPARISON:  None. FINDINGS: Minimally displaced fracture of the tuft of the distal phalanx of the fifth digit. No other acute fracture. There is no dislocation. Mild soft tissue swelling of the fifth digit. No radiopaque foreign object or soft tissue gas. IMPRESSION: Minimally displaced fracture of the tuft of the distal phalanx of the fifth digit. Electronically Signed   By: Elgie Collard M.D.   On: 01/18/2021 19:10    Procedures .Nail Removal  Date/Time: 01/19/2021 12:31 AM Performed by: Alveria Apley, PA-C Authorized by: Alveria Apley, PA-C   Consent:    Consent obtained:  Verbal   Consent given by:  Patient   Risks, benefits, and alternatives were discussed: yes     Risks discussed:  Permanent nail deformity, bleeding and incomplete removal Location:    Hand:  L small finger Anesthesia:    Anesthesia method:  Nerve block   Block location:  L pinky finger   Block needle gauge:  25 G   Block anesthetic:  Lidocaine 1% w/o epi   Block technique:   Tendon sheath   Block injection procedure:  Anatomic landmarks identified, introduced needle, incremental injection, negative aspiration for blood and anatomic landmarks palpated   Block outcome:  Anesthesia achieved Nail Removal:    Nail removed:  Complete   Nail bed repaired: yes     Nail bed repair material:  5-0 vicryl   Number of sutures:  3   Removed nail replaced and anchored: yes   Post-procedure details:    Dressing:  Splint   Procedure  completion:  Tolerated well, no immediate complications   Medications Ordered in ED Medications  lidocaine (PF) (XYLOCAINE) 1 % injection 10 mL (10 mLs Infiltration Given 01/18/21 2250)  Tdap (BOOSTRIX) injection 0.5 mL (0.5 mLs Intramuscular Given 01/18/21 2250)    ED Course  I have reviewed the triage vital signs and the nursing notes.  Pertinent labs & imaging results that were available during my care of the patient were reviewed by me and considered in my medical decision making (see chart for details).    MDM Rules/Calculators/A&P                           Patient presenting for evaluation of left pinky finger injury.  On exam, patient has a laceration extending into the nailbed that will require nail removal and repair.  X-ray obtained from triage viewed and independently interpreted by me, shows a distal tuft fracture.  Will ensure patient is on antibiotics for an open fracture. Tdap given in the ed.   Additionally, he has had pain and swelling of his right index finger for a week.  X-ray obtained from triage negative for acute findings.  Discussed with patient this is likely a jammed finger, and will improve with time.  Discussed use of Tylenol and ibuprofen.  Nail removal and nailbed repair performed as described above.  Discussed aftercare instructions with patient.  Will place on antibiotics.  We will have him follow-up with hand or whoever his employee health wants him to see.  At this time, patient appears safe for discharge.   Return precautions given.  Patient states he understands and agrees to plan.  Final Clinical Impression(s) / ED Diagnoses Final diagnoses:  Laceration of left little finger without foreign body with damage to nail, initial encounter  Open fracture of tuft of distal phalanx of finger    Rx / DC Orders ED Discharge Orders          Ordered    cephALEXin (KEFLEX) 500 MG capsule  3 times daily        01/18/21 2355             Alveria Apley, PA-C 01/19/21 0033    Alvira Monday, MD 01/19/21 2300

## 2021-01-30 IMAGING — DX DG CHEST 1V PORT
2 series · 2 of 2 positions shown · non-contrast
Comparison: None.

CLINICAL DATA: Shortness of breath.

EXAM:
PORTABLE CHEST 1 VIEW

[chest ap (1 of 2)]
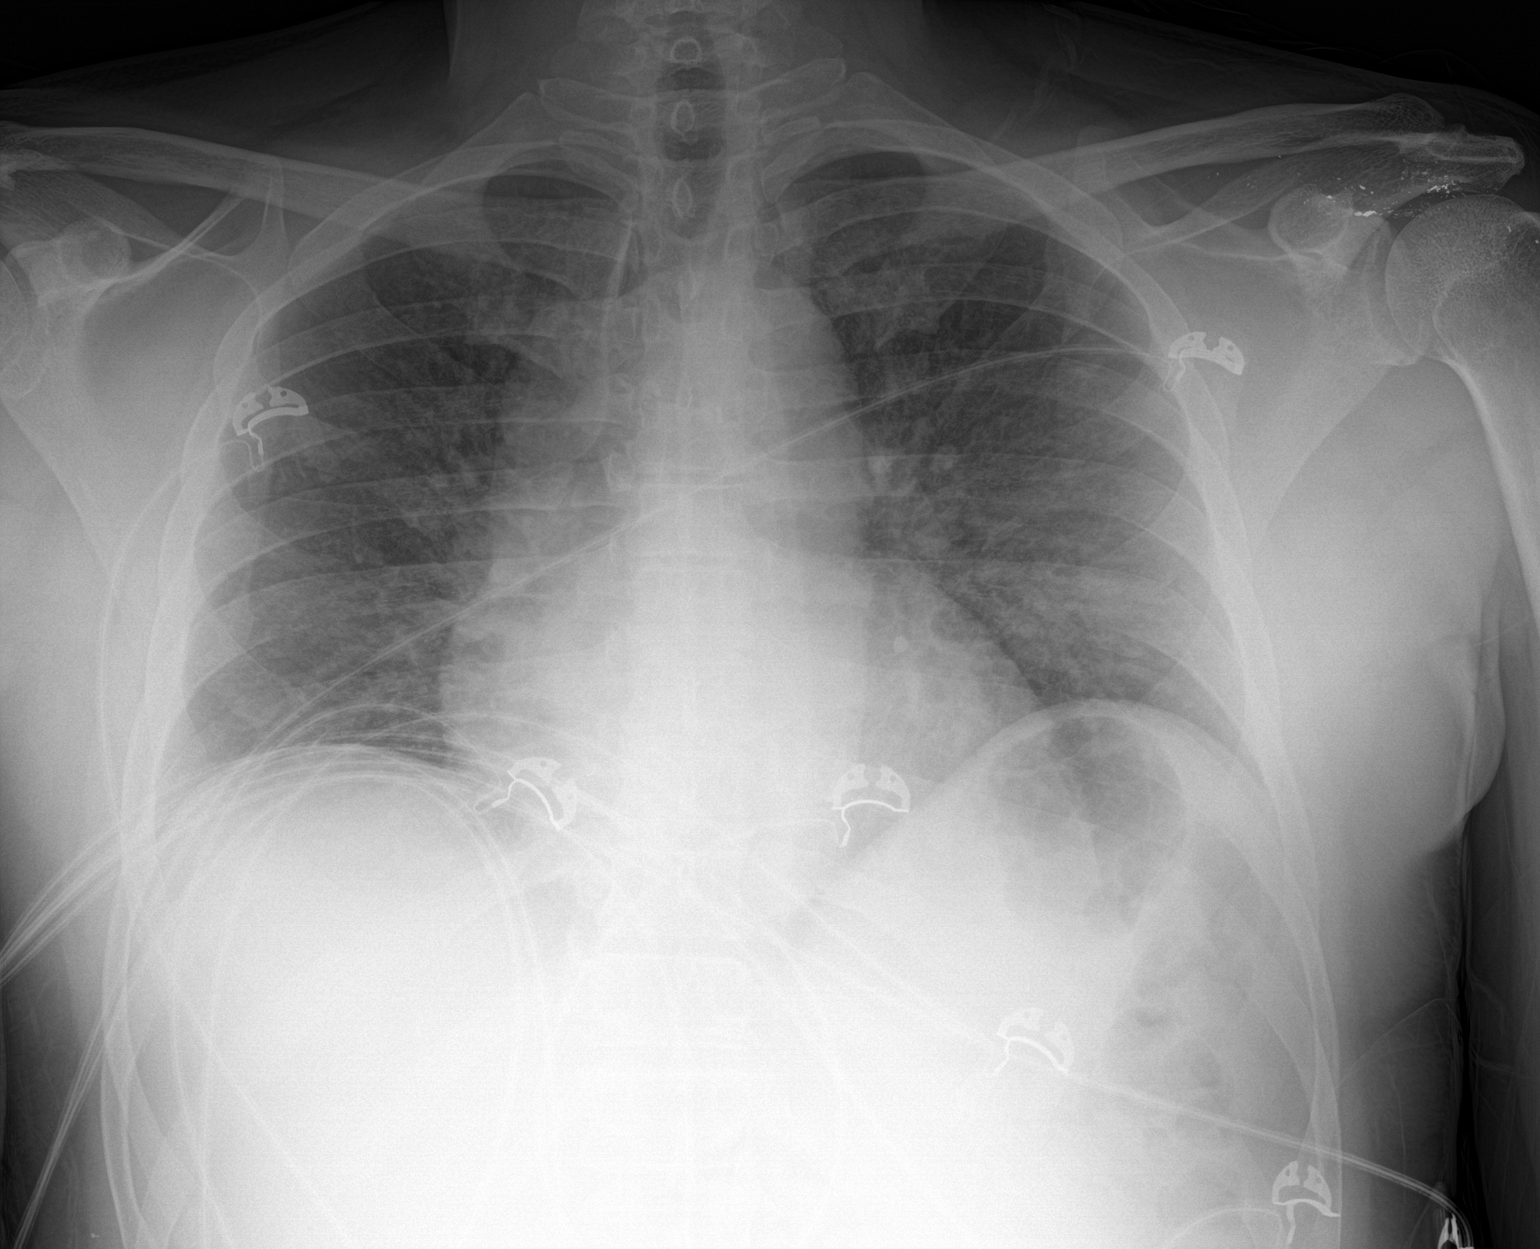

[chest ap (2 of 2)]
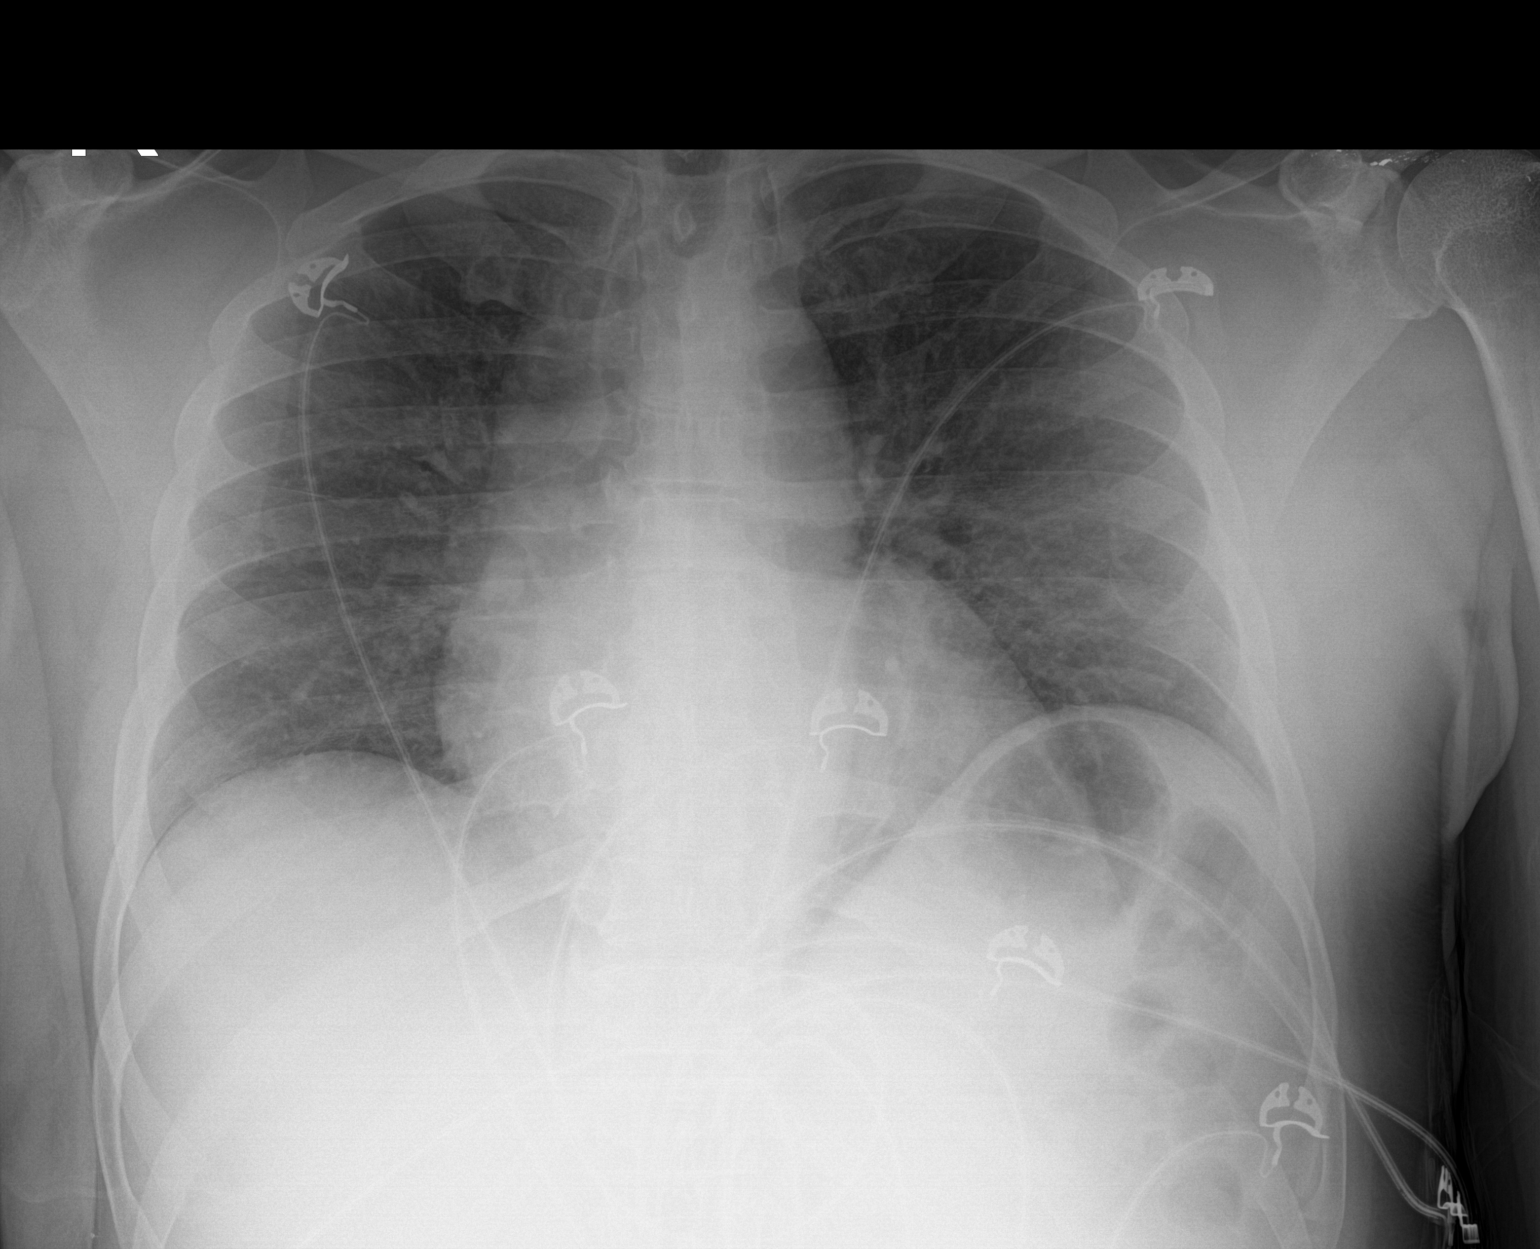

[2 of 2 positions shown; findings below may reference images not displayed]

FINDINGS: The heart size and mediastinal contours are within normal limits.
Both lungs are clear. No pneumothorax or pleural effusion is noted.
The visualized skeletal structures are unremarkable.
IMPRESSION: No active disease.
